# Patient Record
Sex: Male | Born: 1935 | Race: White | Hispanic: No | Smoking: Former smoker
Health system: Southern US, Community
[De-identification: ages and names within clinical notes are randomized; demographics above are authoritative.]

## PROBLEM LIST (undated history)

## (undated) DIAGNOSIS — N400 Enlarged prostate without lower urinary tract symptoms: Secondary | ICD-10-CM

## (undated) DIAGNOSIS — K56609 Unspecified intestinal obstruction, unspecified as to partial versus complete obstruction: Secondary | ICD-10-CM

## (undated) DIAGNOSIS — H269 Unspecified cataract: Secondary | ICD-10-CM

## (undated) DIAGNOSIS — I10 Essential (primary) hypertension: Secondary | ICD-10-CM

## (undated) DIAGNOSIS — E785 Hyperlipidemia, unspecified: Secondary | ICD-10-CM

## (undated) DIAGNOSIS — M1990 Unspecified osteoarthritis, unspecified site: Secondary | ICD-10-CM

## (undated) DIAGNOSIS — E669 Obesity, unspecified: Secondary | ICD-10-CM

## (undated) HISTORY — DX: Obesity, unspecified: E66.9

## (undated) HISTORY — PX: EYE SURGERY: SHX253

## (undated) HISTORY — DX: Unspecified cataract: H26.9

## (undated) HISTORY — DX: Unspecified intestinal obstruction, unspecified as to partial versus complete obstruction: K56.609

## (undated) HISTORY — PX: REPLACEMENT TOTAL KNEE: SUR1224

## (undated) HISTORY — PX: APPENDECTOMY: SHX54

---

## 2002-10-26 ENCOUNTER — Other Ambulatory Visit: Admission: RE | Admit: 2002-10-26 | Discharge: 2002-10-26 | Payer: Self-pay | Admitting: Urology

## 2004-11-13 ENCOUNTER — Other Ambulatory Visit: Admission: RE | Admit: 2004-11-13 | Discharge: 2004-11-13 | Payer: Self-pay | Admitting: Urology

## 2008-05-31 ENCOUNTER — Ambulatory Visit (HOSPITAL_COMMUNITY): Admission: RE | Admit: 2008-05-31 | Discharge: 2008-05-31 | Payer: Self-pay | Admitting: Pulmonary Disease

## 2012-06-16 ENCOUNTER — Emergency Department (HOSPITAL_COMMUNITY)
Admission: EM | Admit: 2012-06-16 | Discharge: 2012-06-16 | Disposition: A | Discharge status: Home / Self Care | Payer: Medicare HMO | Attending: Emergency Medicine | Admitting: Emergency Medicine

## 2012-06-16 ENCOUNTER — Encounter (HOSPITAL_COMMUNITY): Payer: Self-pay

## 2012-06-16 DIAGNOSIS — N401 Enlarged prostate with lower urinary tract symptoms: Secondary | ICD-10-CM | POA: Insufficient documentation

## 2012-06-16 DIAGNOSIS — R339 Retention of urine, unspecified: Secondary | ICD-10-CM | POA: Insufficient documentation

## 2012-06-16 DIAGNOSIS — E785 Hyperlipidemia, unspecified: Secondary | ICD-10-CM | POA: Insufficient documentation

## 2012-06-16 DIAGNOSIS — N138 Other obstructive and reflux uropathy: Secondary | ICD-10-CM | POA: Insufficient documentation

## 2012-06-16 DIAGNOSIS — I10 Essential (primary) hypertension: Secondary | ICD-10-CM | POA: Insufficient documentation

## 2012-06-16 HISTORY — DX: Essential (primary) hypertension: I10

## 2012-06-16 HISTORY — DX: Benign prostatic hyperplasia without lower urinary tract symptoms: N40.0

## 2012-06-16 HISTORY — DX: Hyperlipidemia, unspecified: E78.5

## 2012-06-16 MED ORDER — SOLIFENACIN SUCCINATE 5 MG PO TABS: 5.0000 mg | ORAL_TABLET | Freq: Every day | ORAL | Status: DC

## 2012-06-16 NOTE — ED Provider Notes (Signed)
History     CSN: [id number]  Arrival date & time 06/16/12  0041   First MD Initiated Contact with Patient 06/16/12 [phone]      Chief Complaint  Patient presents with  . Urinary Retention    (Consider location/radiation/quality/duration/timing/severity/associated sxs/prior treatment) HPI Miguel Hardy is a 78 y.o. male with a h/o BPH who presents to the Emergency Department complaining of urinary retention.He has been unable to void completely since last night with frequent trips to the bathroom and small amounts of urine. He has been taken off flomax in the last month. He receives his care at the [facility] (urology) and PCP. Denies fever, chills, flank pain, nausea, vomiting.   PCP [facility] P[facility] Dr. Christell Constant  Past Medical History  Diagnosis Date  . Prostate enlargement   . Hypertension   . Hyperlipidemia     Past Surgical History  Procedure Date  . Appendectomy     No family history on file.  History  Substance Use Topics  . Smoking status: Not on file  . Smokeless tobacco: Not on file  . Alcohol Use: No      Review of Systems  Constitutional: Negative for fever.       10 Systems reviewed and are negative for acute change except as noted in the HPI.  HENT: Negative for congestion.   Eyes: Negative for discharge and redness.  Respiratory: Negative for cough and shortness of breath.   Cardiovascular: Negative for chest pain.  Gastrointestinal: Negative for vomiting and abdominal pain.  Genitourinary: Positive for urgency and difficulty urinating.  Musculoskeletal: Negative for back pain.  Skin: Negative for rash.  Neurological: Negative for syncope, numbness and headaches.  Psychiatric/Behavioral:       No behavior change.    Allergies  Flomax  Home Medications   Current Outpatient Rx  Name Route Sig Dispense Refill  . HYDROCHLOROTHIAZIDE 25 MG PO TABS Oral Take 25 mg by mouth daily.    Marland Kitchen LISINOPRIL 40 MG PO TABS Oral Take 40 mg by mouth daily.    Marland Kitchen PRAVASTATIN SODIUM  40 MG PO TABS Oral Take 40 mg by mouth daily.    Marland Kitchen SOLIFENACIN SUCCINATE 5 MG PO TABS Oral Take 1 tablet (5 mg total) by mouth daily. 30 tablet 0    BP 158/65  Pulse 94  Temp 98.5 F (36.9 C) (Oral)  Resp 20  Ht 5\' 8"  (1.727 m)  Wt 200 lb (90.719 kg)  BMI 30.41 kg/m2  SpO2 94%  Physical Exam  Nursing note and vitals reviewed. Constitutional: He is oriented to person, place, and time. He appears well-developed and well-nourished.       Awake, alert, nontoxic appearance.  HENT:  Head: Atraumatic.  Eyes: Right eye exhibits no discharge. Left eye exhibits no discharge.  Neck: Neck supple.  Cardiovascular: Normal heart sounds.   Pulmonary/Chest: Effort normal and breath sounds normal. He exhibits no tenderness.  Abdominal: Soft. There is tenderness. There is no rebound.       Mild suprapubic tenderness   Genitourinary:       No cva tenderness  Musculoskeletal: He exhibits no tenderness.       Baseline ROM, no obvious new focal weakness.  Neurological: He is alert and oriented to person, place, and time.       Mental status and motor strength appears baseline for patient and situation.  Skin: No rash noted.  Psychiatric: He has a normal mood and affect.    ED Course  Procedures (including  critical care time) No results found for this or any previous visit.   1. Urinary retention       MDM  Patient with urinary retention. Foley placed with small amount of urine but relief of pressure and urgency. Patient did not want to have foley remain. Catheter was removed and patient was discharged on vesicare. He will follow up with his urologist. Pt feels improved after observation and/or treatment in ED.Pt stable in ED with no significant deterioration in condition.The patient appears reasonably screened and/or stabilized for discharge and I doubt any other medical condition or other [facility] requiring further screening, evaluation, or treatment in the ED at this time prior to  discharge.  MDM Reviewed: nursing note and vitals           Nicoletta Dress. Colon Branch, MD 06/16/12 [phone]

## 2012-06-16 NOTE — ED Notes (Signed)
Pt states he has been unable to void completely and is only dribbling, states he feels pressure in abd, admits to prostate issues

## 2012-07-01 DIAGNOSIS — H524 Presbyopia: Secondary | ICD-10-CM | POA: Insufficient documentation

## 2012-07-01 DIAGNOSIS — H5200 Hypermetropia, unspecified eye: Secondary | ICD-10-CM | POA: Insufficient documentation

## 2012-07-01 DIAGNOSIS — H2510 Age-related nuclear cataract, unspecified eye: Secondary | ICD-10-CM | POA: Insufficient documentation

## 2012-07-01 DIAGNOSIS — H43819 Vitreous degeneration, unspecified eye: Secondary | ICD-10-CM | POA: Insufficient documentation

## 2013-03-21 ENCOUNTER — Other Ambulatory Visit: Payer: Self-pay | Admitting: *Deleted

## 2013-03-21 MED ORDER — PRAVASTATIN SODIUM 40 MG PO TABS: 40.0000 mg | ORAL_TABLET | Freq: Every day | ORAL | Status: DC

## 2013-03-21 NOTE — Telephone Encounter (Signed)
Okay x1 needs an appointment to be seen by a provider

## 2013-03-21 NOTE — Telephone Encounter (Signed)
LAST LABS 6/13. NTBS

## 2013-07-11 ENCOUNTER — Encounter (INDEPENDENT_AMBULATORY_CARE_PROVIDER_SITE_OTHER): Payer: Self-pay

## 2013-07-11 ENCOUNTER — Ambulatory Visit (INDEPENDENT_AMBULATORY_CARE_PROVIDER_SITE_OTHER): Payer: Medicare HMO

## 2013-07-11 ENCOUNTER — Ambulatory Visit (INDEPENDENT_AMBULATORY_CARE_PROVIDER_SITE_OTHER): Payer: Medicare HMO | Admitting: Family Medicine

## 2013-07-11 DIAGNOSIS — R0781 Pleurodynia: Secondary | ICD-10-CM

## 2013-07-11 DIAGNOSIS — R079 Chest pain, unspecified: Secondary | ICD-10-CM

## 2013-07-11 MED ORDER — HYDROCODONE-ACETAMINOPHEN 5-325 MG PO TABS: 1.0000 | ORAL_TABLET | Freq: Four times a day (QID) | ORAL | Status: DC | PRN

## 2013-07-11 MED ORDER — NAPROXEN 500 MG PO TABS: 500.0000 mg | ORAL_TABLET | Freq: Two times a day (BID) | ORAL | Status: DC

## 2013-07-11 NOTE — Progress Notes (Signed)
  Subjective:    Patient ID: Miguel Hardy, male    DOB: 17-Aug-1930, 79 y.o.   MRN: [id number]  HPI  This 79 y.o. male presents for evaluation of right chest and side discomfort after falling and hitting a log 2 days ago.  He states he slipped when he was attempting to walk over a log and then the next thing he  Realized was that he hit his right face and side and was laying the log and he may have had "a little" LOC and when he came to he was laying across the log.  He has been having severe pain when coughing And he denies any SOB. He denies anymore LOC or AMS or mental status changes..  Review of Systems No chest pain, SOB, HA, dizziness, vision change, N/V, diarrhea, constipation, dysuria, urinary urgency or frequency, myalgias, arthralgias or rash.     Objective:   Physical Exam  Vital signs noted  Well developed well nourished male.  HEENT - Head atraumatic Normocephalic                Eyes - PERRLA, Conjuctiva - clear Sclera- Clear EOMI                Ears - EAC's Wnl TM's Wnl Gross Hearing WNL                Nose - Nares patent                 Throat - oropharanx wnl Respiratory - Lungs CTA bilateral Cardiac - RRR S1 and S2 without murmur GI - Abdomen soft Nontender and bowel sounds active x 4 Extremities - No edema. Neuro - Grossly intact.  CXR - Normal Right rib series - No fx Prelimnary reading by Angeline Slim    Assessment & Plan:  Rib pain - Plan: DG Ribs Unilateral W/Chest Right, HYDROcodone-acetaminophen (NORCO) 5-325 MG per tablet, naproxen (NAPROSYN) 500 MG tablet  Discussed with patient that he needs to splint his right ribs

## 2013-07-11 NOTE — Patient Instructions (Signed)

## 2013-07-13 DIAGNOSIS — H52229 Regular astigmatism, unspecified eye: Secondary | ICD-10-CM | POA: Insufficient documentation

## 2013-09-19 ENCOUNTER — Telehealth: Payer: Self-pay | Admitting: Family Medicine

## 2013-09-19 NOTE — Telephone Encounter (Signed)
Patient vomiting and diarrhea since Saturday can we please call in something for the vomiting patient doesn't feel like he can get here

## 2013-09-20 NOTE — Telephone Encounter (Signed)
Follow up today please

## 2013-09-20 NOTE — Telephone Encounter (Signed)
Patient was on way to urgent care

## 2013-09-21 ENCOUNTER — Emergency Department (HOSPITAL_COMMUNITY): Payer: Medicare HMO

## 2013-09-21 ENCOUNTER — Encounter (HOSPITAL_COMMUNITY): Payer: Self-pay | Admitting: Emergency Medicine

## 2013-09-21 ENCOUNTER — Inpatient Hospital Stay (HOSPITAL_COMMUNITY)
Admission: EM | Admit: 2013-09-21 | Discharge: 2013-09-28 | DRG: 389 | Disposition: A | Discharge status: Home / Self Care | Payer: Medicare HMO | Attending: Internal Medicine | Admitting: Internal Medicine

## 2013-09-21 DIAGNOSIS — Z888 Allergy status to other drugs, medicaments and biological substances status: Secondary | ICD-10-CM

## 2013-09-21 DIAGNOSIS — E7800 Pure hypercholesterolemia, unspecified: Secondary | ICD-10-CM | POA: Diagnosis present

## 2013-09-21 DIAGNOSIS — E876 Hypokalemia: Secondary | ICD-10-CM

## 2013-09-21 DIAGNOSIS — E871 Hypo-osmolality and hyponatremia: Secondary | ICD-10-CM

## 2013-09-21 DIAGNOSIS — K559 Vascular disorder of intestine, unspecified: Secondary | ICD-10-CM | POA: Diagnosis present

## 2013-09-21 DIAGNOSIS — Z79899 Other long term (current) drug therapy: Secondary | ICD-10-CM

## 2013-09-21 DIAGNOSIS — I10 Essential (primary) hypertension: Secondary | ICD-10-CM | POA: Diagnosis present

## 2013-09-21 DIAGNOSIS — E870 Hyperosmolality and hypernatremia: Secondary | ICD-10-CM | POA: Diagnosis present

## 2013-09-21 DIAGNOSIS — N179 Acute kidney failure, unspecified: Secondary | ICD-10-CM | POA: Diagnosis present

## 2013-09-21 DIAGNOSIS — K56609 Unspecified intestinal obstruction, unspecified as to partial versus complete obstruction: Principal | ICD-10-CM | POA: Diagnosis present

## 2013-09-21 DIAGNOSIS — R1110 Vomiting, unspecified: Secondary | ICD-10-CM

## 2013-09-21 DIAGNOSIS — E860 Dehydration: Secondary | ICD-10-CM | POA: Diagnosis present

## 2013-09-21 DIAGNOSIS — Z87891 Personal history of nicotine dependence: Secondary | ICD-10-CM

## 2013-09-21 DIAGNOSIS — I959 Hypotension, unspecified: Secondary | ICD-10-CM

## 2013-09-21 LAB — COMPREHENSIVE METABOLIC PANEL
ALT: 28 U/L (ref 0–53)
AST: 24 U/L (ref 0–37)
Albumin: 3.9 g/dL (ref 3.5–5.2)
Alkaline Phosphatase: 60 U/L (ref 39–117)
Chloride: 80 mEq/L — ABNORMAL LOW (ref 96–112)
Potassium: 3.4 mEq/L — ABNORMAL LOW (ref 3.7–5.3)
Total Bilirubin: 0.7 mg/dL (ref 0.3–1.2)

## 2013-09-21 LAB — CBC WITH DIFFERENTIAL/PLATELET
Basophils Absolute: 0 10*3/uL (ref 0.0–0.1)
Basophils Relative: 0 % (ref 0–1)
Hemoglobin: 15.3 g/dL (ref 13.0–17.0)
MCHC: 35.7 g/dL (ref 30.0–36.0)
Neutro Abs: 4.1 10*3/uL (ref 1.7–7.7)
Neutrophils Relative %: 68 % (ref 43–77)
RDW: 13.7 % (ref 11.5–15.5)
WBC: 6 10*3/uL (ref 4.0–10.5)

## 2013-09-21 LAB — URINALYSIS, ROUTINE W REFLEX MICROSCOPIC
Bilirubin Urine: NEGATIVE
Glucose, UA: NEGATIVE mg/dL
Ketones, ur: NEGATIVE mg/dL
Leukocytes, UA: NEGATIVE
pH: 5 (ref 5.0–8.0)

## 2013-09-21 MED ORDER — SODIUM CHLORIDE 0.9 % IV BOLUS (SEPSIS)
1000.0000 mL | Freq: Once | INTRAVENOUS | Status: AC
  Administered 2013-09-21: 1000 mL via INTRAVENOUS

## 2013-09-21 MED ORDER — POTASSIUM CHLORIDE 10 MEQ/100ML IV SOLN
10.0000 meq | Freq: Once | INTRAVENOUS | Status: AC
  Administered 2013-09-21: 10 meq via INTRAVENOUS
  Filled 2013-09-21: qty 100

## 2013-09-21 MED ORDER — ONDANSETRON HCL 4 MG/2ML IJ SOLN: 4.0000 mg | Freq: Once | INTRAMUSCULAR | Status: DC

## 2013-09-21 MED ORDER — ONDANSETRON HCL 4 MG/2ML IJ SOLN
INTRAMUSCULAR | Status: AC
  Filled 2013-09-21: qty 2

## 2013-09-21 MED ORDER — ONDANSETRON HCL 4 MG/2ML IJ SOLN
4.0000 mg | Freq: Once | INTRAMUSCULAR | Status: AC
  Administered 2013-09-21: 4 mg via INTRAVENOUS
  Filled 2013-09-21: qty 2

## 2013-09-21 MED ORDER — IOHEXOL 300 MG/ML  SOLN
50.0000 mL | Freq: Once | INTRAMUSCULAR | Status: AC | PRN
  Administered 2013-09-21: 50 mL via ORAL

## 2013-09-21 MED ORDER — FENTANYL CITRATE 0.05 MG/ML IJ SOLN
50.0000 ug | Freq: Once | INTRAMUSCULAR | Status: AC
  Administered 2013-09-21: 50 ug via INTRAVENOUS
  Filled 2013-09-21: qty 2

## 2013-09-21 MED ORDER — ONDANSETRON HCL 4 MG/2ML IJ SOLN
4.0000 mg | Freq: Once | INTRAMUSCULAR | Status: AC
Start: 2013-09-21 — End: 2013-09-21
  Administered 2013-09-21: 4 mg via INTRAVENOUS

## 2013-09-21 NOTE — ED Provider Notes (Signed)
CSN: [id number]     Arrival date & time 09/21/13  1841 History   First MD Initiated Contact with Patient 09/21/13 1902     Chief Complaint  Patient presents with  . Emesis   (Consider location/radiation/quality/duration/timing/severity/associated sxs/prior Treatment) HPI Comments: 79 yo male with appendix, htn, lipids hx presents with recurrent vomiting and diarrhea, non bloody since Saturday.  Mild sick contacts with gastritis. Pt recently came back from [place]. No new foods.  No recent abx.  No bowel obstruction hx.  Subjective fevers.  Central abdominal pain, cramping.  Nothing improves.  Intermittent.  Patient is a 79 y.o. male presenting with vomiting. The history is provided by the patient.  Emesis Severity:  Moderate Associated symptoms: abdominal pain and diarrhea   Associated symptoms: no chills and no headaches     Past Medical History  Diagnosis Date  . Prostate enlargement   . Hypertension   . Hyperlipidemia    Past Surgical History  Procedure Laterality Date  . Appendectomy     History reviewed. No pertinent family history. History  Substance Use Topics  . Smoking status: Former Smoker -- 1.00 packs/day    Types: Cigarettes    Start date: 02/08/1948    Quit date: 09/22/1974  . Smokeless tobacco: Not on file  . Alcohol Use: No    Review of Systems  Constitutional: Positive for fever, appetite change and fatigue. Negative for chills.  HENT: Negative for congestion.   Eyes: Negative for visual disturbance.  Respiratory: Negative for shortness of breath.   Cardiovascular: Negative for chest pain.  Gastrointestinal: Positive for nausea, vomiting, abdominal pain and diarrhea. Negative for blood in stool.  Genitourinary: Negative for dysuria and flank pain.  Musculoskeletal: Negative for back pain, neck pain and neck stiffness.  Skin: Negative for rash.  Neurological: Positive for light-headedness. Negative for headaches.    Allergies  Flomax  Home  Medications   Current Outpatient Rx  Name  Route  Sig  Dispense  Refill  . atorvastatin (LIPITOR) 40 MG tablet   Oral   Take 40 mg by mouth daily.         . hydrochlorothiazide (HYDRODIURIL) 25 MG tablet   Oral   Take 25 mg by mouth daily.         Marland Kitchen HYDROcodone-acetaminophen (NORCO) 5-325 MG per tablet   Oral   Take 1 tablet by mouth every 6 (six) hours as needed for pain.   30 tablet   0   . lisinopril (PRINIVIL,ZESTRIL) 40 MG tablet   Oral   Take 40 mg by mouth daily.         . naproxen (NAPROSYN) 500 MG tablet   Oral   Take 1 tablet (500 mg total) by mouth 2 (two) times daily with a meal.   20 tablet   0   . solifenacin (VESICARE) 5 MG tablet   Oral   Take 1 tablet (5 mg total) by mouth daily.   30 tablet   0    BP 64/22  Pulse 95  Temp(Src) 98 F (36.7 C) (Oral)  Ht 5\' 8"  (1.727 m)  Wt 195 lb (88.451 kg)  BMI 29.66 kg/m2  SpO2 97% Physical Exam  Nursing note and vitals reviewed. Constitutional: He is oriented to person, place, and time. He appears well-developed and well-nourished.  HENT:  Head: Normocephalic and atraumatic.  Dry mm  Eyes: Conjunctivae are normal. Right eye exhibits no discharge. Left eye exhibits no discharge.  Neck: Normal range of motion.  Neck supple. No tracheal deviation present.  Cardiovascular: Normal rate and regular rhythm.   Pulmonary/Chest: Effort normal and breath sounds normal.  Abdominal: Soft. He exhibits distension (tympanic BS). There is tenderness (diffuse, worse central). There is no guarding.  Musculoskeletal: He exhibits no edema.  Neurological: He is alert and oriented to person, place, and time.  Skin: Skin is warm. No rash noted.  Psychiatric: He has a normal mood and affect.    ED Course  Procedures (including critical care time) EMERGENCY DEPARTMENT ULTRASOUND  Study: Limited Retroperitoneal Ultrasound of the Abdominal Aorta.  INDICATIONS:Hypotension, Abdominal pain and Age>55 Multiple views of the  abdominal aorta were obtained in real-time from the diaphragmatic hiatus to the aortic bifurcation in transverse planes with a multi-frequency probe. PERFORMED BY: Myself IMAGES ARCHIVED?: Yes FINDINGS: Free fluid absent LIMITATIONS:  Body habitus, Bowel gas and Abdominal pain INTERPRETATION:  No abdominal aortic aneurysm only distal visualized    Labs Review Labs Reviewed  CBC WITH DIFFERENTIAL - Abnormal; Notable for the following:    Monocytes Relative 15 (*)    All other components within normal limits  COMPREHENSIVE METABOLIC PANEL - Abnormal; Notable for the following:    Sodium 129 (*)    Potassium 3.4 (*)    Chloride 80 (*)    Glucose, Bld 136 (*)    BUN 129 (*)    Creatinine, Ser 4.00 (*)    GFR calc non Af Amer 13 (*)    GFR calc Af Amer 15 (*)    All other components within normal limits  URINALYSIS, ROUTINE W REFLEX MICROSCOPIC - Abnormal; Notable for the following:    Specific Gravity, Urine >1.030 (*)    All other components within normal limits  LIPASE, BLOOD - Abnormal; Notable for the following:    Lipase 61 (*)    All other components within normal limits  LACTIC ACID, PLASMA   Imaging Review Dg Abd Acute W/chest  09/21/2013   CLINICAL DATA:  Nausea vomiting and diarrhea for 4 days.  EXAM: ACUTE ABDOMEN SERIES (ABDOMEN 2 VIEW & CHEST 1 VIEW)  COMPARISON:  Chest x-ray 06/2013 and 05/31/2008  FINDINGS: Examination demonstrates the lungs to be adequately inflated without consolidation or effusion. The cardiomediastinal silhouette and remainder of the thorax is unchanged.  Abdominal pelvic images demonstrate multiple air-filled dilated small bowel loops measuring up to 6 cm in diameter. There are several scattered air-fluid levels. There is no free peritoneal air. There are degenerative changes of the spine and hips.  IMPRESSION: Multiple air-filled moderately dilated small bowel loops with air-fluid levels suggesting distal small bowel obstruction and less likely  ileus.  No acute cardiopulmonary disease.   Electronically Signed   By: Elberta Fortis M.D.   On: 09/21/2013 21:02    EKG Interpretation   None       MDM   1. SBO (small bowel obstruction)   2. ARF (acute renal failure)   3. Dehydration   4. Vomiting   Hypotension  Pt presented with abd pain, vomiting and hypotension. Bedside US difficult due to bowel gas but portions of abd aorta normal size, no free fluid in abdomen. Clinically dry.  IV fluids, zofran, labs. Concern for GE vs partial bowel obstruction vs other. Second L bolus given. Xrays show signs of SBO.  Paged surgery and triad for admission. Dr Lovell Sheehan rec CT no contrast then admit to [facility], NG if CT confirms SBO.  The patients results and plan were reviewed and discussed.   Any  x-rays performed were personally reviewed by myself.   Differential diagnosis were considered with the presenting HPI.  Diagnosis: above  Admission/ observation were discussed with the admitting physician, patient and/or family and they are comfortable with the plan.  Signed out to fup CT results and ensure admission.    Enid Skeens, MD 09/21/13 2126

## 2013-09-21 NOTE — ED Notes (Signed)
Vomiting and diarrhea for 4 days, seen at [facility] yesterday and diarrhea is improved , but cont to vomit

## 2013-09-21 NOTE — ED Notes (Signed)
Report called to Shauna, RN on unit 300. 

## 2013-09-21 NOTE — H&P (Addendum)
[facility] History and Physical  GOPAL MALTER ZOX:[id number] DOB: 11-19-1929 DOA: 09/21/2013   PCP: Rudi Heap, MD  Specialists: None  Chief Complaint: Vomiting since Saturday  HPI: Miguel Hardy is a 79 y.o. male with a past medical history of hypertension, and hypercholesterolemia, who was in his usual state of health of this past Saturday when he started having episodes of diarrhea, along with vomiting. He also had abdominal pain, which was 6/10 in intensity, which was aching pain in the lower abdomen. There was no radiation of the pain. No precipitating, aggravating or relieving factors were identified. Denies any blood in the stool or in the emesis. The diarrhea subsided after one day, but he continued to vomit 3-4 times a day. He had fever and chills, but did not check his temperature. He also had abdominal bloating. He passed gas, about 30 minutes ago. He's never had this kind of problem in the past. Denies any problems passing urine. He has been constipated in the past. Has had some dizziness, but denies any syncopal episodes.  Home Medications: Prior to Admission medications   Medication Sig Start Date End Date Taking? Authorizing Provider  atorvastatin (LIPITOR) 40 MG tablet Take 40 mg by mouth daily.   Yes Historical Provider, MD  dicyclomine (BENTYL) 10 MG capsule Take 10 mg by mouth 4 (four) times daily -  before meals and at bedtime. 09/20/13  Yes Historical Provider, MD  diphenoxylate-atropine (LOMOTIL) 2.5-0.025 MG per tablet Take 1 tablet by mouth as needed for diarrhea or loose stools.  09/20/13  Yes Historical Provider, MD  hydrochlorothiazide (HYDRODIURIL) 25 MG tablet Take 25 mg by mouth daily.   Yes Historical Provider, MD  lisinopril (PRINIVIL,ZESTRIL) 40 MG tablet Take 40 mg by mouth daily.   Yes Historical Provider, MD    Allergies:  Allergies  Allergen Reactions  . Flomax [Tamsulosin Hcl]     Past Medical History: Past Medical History  Diagnosis Date   . Prostate enlargement   . Hypertension   . Hyperlipidemia     Past Surgical History  Procedure Laterality Date  . Appendectomy      Social History: He lives in [place] with his daughter. Quit smoking in 1976. No alcohol use. No illicit drug use. Independent with daily activities.  Family History: He denies any medical problems in the family  Review of Systems - History obtained from the patient General ROS: positive for  - fatigue Psychological ROS: negative Ophthalmic ROS: negative ENT ROS: negative Allergy and Immunology ROS: negative Hematological and Lymphatic ROS: negative Endocrine ROS: negative Respiratory ROS: no cough, shortness of breath, or wheezing Cardiovascular ROS: no chest pain or dyspnea on exertion Gastrointestinal ROS: as in hpi Genito-Urinary ROS: no dysuria, trouble voiding, or hematuria Musculoskeletal ROS: negative Neurological ROS: no TIA or stroke symptoms Dermatological ROS: negative  Physical Examination  Filed Vitals:   09/21/13 1905 09/21/13 2100 09/21/13 2101 09/21/13 2200  BP: 90/62 151/83 151/83 148/67  Pulse:  46 74 78  Temp:      TempSrc:      Resp:   16   Height:      Weight:      SpO2:  95% 96% 98%    General appearance: alert, cooperative, appears stated age, no distress and moderately obese Head: Normocephalic, without obvious abnormality, atraumatic Eyes: conjunctivae/corneas clear. PERRL, EOM's intact.  Throat: very dry mucus membranes Resp: clear to auscultation bilaterally Cardio: regular rate and rhythm, S1, S2 normal, no murmur, click, rub or  gallop GI: Abdomen is distended, but soft. There is some tenderness in the lower abdomen without any rebound, rigidity, or guarding. No masses, or organomegaly. Bowel sounds are present. Extremities: extremities normal, atraumatic, no cyanosis or edema Pulses: 2+ and symmetric Skin: Skin color, texture, turgor normal. No rashes or lesions Lymph nodes: Cervical,  supraclavicular, and axillary nodes normal. Neurologic: No focal deficits are present.  Laboratory Data: Results for orders placed during the hospital encounter of 09/21/13 (from the past 48 hour(s))  CBC WITH DIFFERENTIAL     Status: Abnormal   Collection Time    09/21/13  7:25 PM      Result Value Range   WBC 6.0  4.0 - 10.5 K/uL   RBC 5.03  4.22 - 5.81 MIL/uL   Hemoglobin 15.3  13.0 - 17.0 g/dL   HCT 40.9  81.1 - 91.4 %   MCV 85.3  78.0 - 100.0 fL   MCH 30.4  26.0 - 34.0 pg   MCHC 35.7  30.0 - 36.0 g/dL   RDW 78.2  95.6 - 21.3 %   Platelets 368  150 - 400 K/uL   Neutrophils Relative % 68  43 - 77 %   Neutro Abs 4.1  1.7 - 7.7 K/uL   Lymphocytes Relative 16  12 - 46 %   Lymphs Abs 1.0  0.7 - 4.0 K/uL   Monocytes Relative 15 (*) 3 - 12 %   Monocytes Absolute 0.9  0.1 - 1.0 K/uL   Eosinophils Relative 1  0 - 5 %   Eosinophils Absolute 0.1  0.0 - 0.7 K/uL   Basophils Relative 0  0 - 1 %   Basophils Absolute 0.0  0.0 - 0.1 K/uL  COMPREHENSIVE METABOLIC PANEL     Status: Abnormal   Collection Time    09/21/13  7:25 PM      Result Value Range   Sodium 129 (*) 137 - 147 mEq/L   Comment: Please note change in reference range.   Potassium 3.4 (*) 3.7 - 5.3 mEq/L   Comment: Please note change in reference range.   Chloride 80 (*) 96 - 112 mEq/L   CO2 24  19 - 32 mEq/L   Glucose, Bld 136 (*) 70 - 99 mg/dL   BUN 086 (*) 6 - 23 mg/dL   Creatinine, Ser 5.78 (*) 0.50 - 1.35 mg/dL   Calcium 9.5  8.4 - 46.9 mg/dL   Total Protein 8.1  6.0 - 8.3 g/dL   Albumin 3.9  3.5 - 5.2 g/dL   AST 24  0 - 37 U/L   ALT 28  0 - 53 U/L   Alkaline Phosphatase 60  39 - 117 U/L   Total Bilirubin 0.7  0.3 - 1.2 mg/dL   GFR calc non Af Amer 13 (*) >90 mL/min   GFR calc Af Amer 15 (*) >90 mL/min   Comment: (NOTE)     The eGFR has been calculated using the CKD EPI equation.     This calculation has not been validated in all clinical situations.     eGFR's persistently <90 mL/min signify possible  Chronic Kidney     Disease.  LACTIC ACID, PLASMA     Status: None   Collection Time    09/21/13  7:25 PM      Result Value Range   Lactic Acid, Venous 1.6  0.5 - 2.2 mmol/L  LIPASE, BLOOD     Status: Abnormal   Collection Time  09/21/13  7:25 PM      Result Value Range   Lipase 61 (*) 11 - 59 U/L  URINALYSIS, ROUTINE W REFLEX MICROSCOPIC     Status: Abnormal   Collection Time    09/21/13  7:48 PM      Result Value Range   Color, Urine YELLOW  YELLOW   APPearance CLEAR  CLEAR   Specific Gravity, Urine >1.030 (*) 1.005 - 1.030   pH 5.0  5.0 - 8.0   Glucose, UA NEGATIVE  NEGATIVE mg/dL   Hgb urine dipstick NEGATIVE  NEGATIVE   Bilirubin Urine NEGATIVE  NEGATIVE   Ketones, ur NEGATIVE  NEGATIVE mg/dL   Protein, ur NEGATIVE  NEGATIVE mg/dL   Urobilinogen, UA 0.2  0.0 - 1.0 mg/dL   Nitrite NEGATIVE  NEGATIVE   Leukocytes, UA NEGATIVE  NEGATIVE   Comment: MICROSCOPIC NOT DONE ON URINES WITH NEGATIVE PROTEIN, BLOOD, LEUKOCYTES, NITRITE, OR GLUCOSE <1000 mg/dL.    Radiology Reports: Dg Abd Acute W/chest  09/21/2013   CLINICAL DATA:  Nausea vomiting and diarrhea for 4 days.  EXAM: ACUTE ABDOMEN SERIES (ABDOMEN 2 VIEW & CHEST 1 VIEW)  COMPARISON:  Chest x-ray 06/2013 and 05/31/2008  FINDINGS: Examination demonstrates the lungs to be adequately inflated without consolidation or effusion. The cardiomediastinal silhouette and remainder of the thorax is unchanged.  Abdominal pelvic images demonstrate multiple air-filled dilated small bowel loops measuring up to 6 cm in diameter. There are several scattered air-fluid levels. There is no free peritoneal air. There are degenerative changes of the spine and hips.  IMPRESSION: Multiple air-filled moderately dilated small bowel loops with air-fluid levels suggesting distal small bowel obstruction and less likely ileus.  No acute cardiopulmonary disease.   Electronically Signed   By: Elberta Fortis M.D.   On: 09/21/2013 21:02    Problem  List  Principal Problem:   SBO (small bowel obstruction) Active Problems:   ARF (acute renal failure)   HTN (hypertension), benign   Hypercholesterolemia   Dehydration   Assessment: This is 79 year old, Caucasian male, with a past medical history as stated earlier, who presents with vomiting since Saturday. His abdominal film is concerning for small bowel obstruction. CT of his abdomen and pelvis is pending at this time. He reports appendectomy in the past and so could have adhesions. He is also noted to have acute renal failure, which is likely due to prerenal azotemia.  Plan: #1 possible small bowel obstruction: General surgery has been consulted and they will see the patient in the morning. Depending on the results of the CT scan a NG tube may have to be placed. He'll be kept n.p.o.  #2 acute renal failure: No older labs are available. This is most likely acute due to dehydration. And, then he was also on ACE inhibitor and diuretic at home. We'll give him IV fluids. Monitor urine output closely. If his renal function does not improve by the morning ultrasound of his kidneys will be obtained.  #3 mildly elevated lipase levels: Most likely due to vomiting. We will trend. LFTs are normal.  #4 history of hypertension: Blood pressure was low when he initially presented but it quickly improved with IV fluids. Hold his oral antihypertensive agents for now.   DVT Prophylaxis: Heparin Code Status: Full code Family Communication: Discussed with the patient and his daughter  Disposition Plan: Admit to MedSurg   Further management decisions will depend on results of further testing and patient's response to treatment.  Trevel Dillenbeck  [facility] Pager [phone]  If 7PM-7AM, please contact night-coverage [email or web address] Password [facility]  09/21/2013, 11:25 PM  CT abdomen pelvis results were reviewed. I was called by the ED physician. He will notify Dr. Lovell Sheehan. Lactic acid level was  normal. Further management will be deferred to the general surgeon.  David Towson 12:01 AM

## 2013-09-22 ENCOUNTER — Inpatient Hospital Stay (HOSPITAL_COMMUNITY): Payer: Medicare HMO

## 2013-09-22 ENCOUNTER — Encounter (HOSPITAL_COMMUNITY): Payer: Self-pay | Admitting: *Deleted

## 2013-09-22 DIAGNOSIS — N179 Acute kidney failure, unspecified: Secondary | ICD-10-CM

## 2013-09-22 DIAGNOSIS — K5289 Other specified noninfective gastroenteritis and colitis: Secondary | ICD-10-CM

## 2013-09-22 DIAGNOSIS — K56609 Unspecified intestinal obstruction, unspecified as to partial versus complete obstruction: Secondary | ICD-10-CM

## 2013-09-22 DIAGNOSIS — K559 Vascular disorder of intestine, unspecified: Secondary | ICD-10-CM | POA: Diagnosis present

## 2013-09-22 DIAGNOSIS — I10 Essential (primary) hypertension: Secondary | ICD-10-CM

## 2013-09-22 DIAGNOSIS — K560 Paralytic ileus: Secondary | ICD-10-CM

## 2013-09-22 DIAGNOSIS — E860 Dehydration: Secondary | ICD-10-CM

## 2013-09-22 HISTORY — DX: Unspecified intestinal obstruction, unspecified as to partial versus complete obstruction: K56.609

## 2013-09-22 LAB — APTT: aPTT: 30 seconds (ref 24–37)

## 2013-09-22 LAB — PROTIME-INR
INR: 1.1 (ref 0.00–1.49)
Prothrombin Time: 14 seconds (ref 11.6–15.2)

## 2013-09-22 MED ORDER — SODIUM CHLORIDE 0.9 % IV SOLN
INTRAVENOUS | Status: DC
  Administered 2013-09-22: 01:00:00 via INTRAVENOUS
  Administered 2013-09-22: 100 mL/h via INTRAVENOUS
  Administered 2013-09-23 – 2013-09-25 (×4): via INTRAVENOUS

## 2013-09-22 MED ORDER — POTASSIUM CHLORIDE 10 MEQ/100ML IV SOLN
10.0000 meq | Freq: Once | INTRAVENOUS | Status: AC
  Administered 2013-09-22: 10 meq via INTRAVENOUS
  Filled 2013-09-22: qty 100

## 2013-09-22 MED ORDER — ONDANSETRON HCL 4 MG/2ML IJ SOLN
4.0000 mg | Freq: Four times a day (QID) | INTRAMUSCULAR | Status: DC | PRN
Start: 2013-09-22 — End: 2013-09-28
  Administered 2013-09-24: 4 mg via INTRAVENOUS
  Filled 2013-09-22: qty 2

## 2013-09-22 MED ORDER — ACETAMINOPHEN 650 MG RE SUPP
650.0000 mg | Freq: Four times a day (QID) | RECTAL | Status: DC | PRN
  Administered 2013-09-24: 650 mg via RECTAL
  Filled 2013-09-22: qty 1

## 2013-09-22 MED ORDER — VANCOMYCIN HCL IN DEXTROSE 1-5 GM/200ML-% IV SOLN: 1000.0000 mg | INTRAVENOUS | Status: DC

## 2013-09-22 MED ORDER — HYDROMORPHONE HCL PF 1 MG/ML IJ SOLN: 1.0000 mg | INTRAMUSCULAR | Status: DC | PRN

## 2013-09-22 MED ORDER — PIPERACILLIN-TAZOBACTAM 3.375 G IVPB
INTRAVENOUS | Status: AC
  Filled 2013-09-22: qty 50

## 2013-09-22 MED ORDER — VANCOMYCIN HCL 10 G IV SOLR
1750.0000 mg | Freq: Once | INTRAVENOUS | Status: DC
  Filled 2013-09-22: qty 1750

## 2013-09-22 MED ORDER — PIPERACILLIN-TAZOBACTAM IN DEX 2-0.25 GM/50ML IV SOLN
2.2500 g | Freq: Four times a day (QID) | INTRAVENOUS | Status: DC
  Administered 2013-09-22 – 2013-09-23 (×3): 2.25 g via INTRAVENOUS
  Filled 2013-09-22 (×6): qty 50

## 2013-09-22 MED ORDER — VANCOMYCIN HCL IN DEXTROSE 1-5 GM/200ML-% IV SOLN
INTRAVENOUS | Status: AC
  Filled 2013-09-22: qty 200

## 2013-09-22 MED ORDER — ONDANSETRON HCL 4 MG PO TABS
4.0000 mg | ORAL_TABLET | Freq: Four times a day (QID) | ORAL | Status: DC | PRN
Start: 2013-09-22 — End: 2013-09-28

## 2013-09-22 MED ORDER — PIPERACILLIN-TAZOBACTAM 3.375 G IVPB 30 MIN
3.3750 g | Freq: Once | INTRAVENOUS | Status: AC
  Administered 2013-09-22: 3.375 g via INTRAVENOUS
  Filled 2013-09-22: qty 50

## 2013-09-22 MED ORDER — PIPERACILLIN-TAZOBACTAM IN DEX 2-0.25 GM/50ML IV SOLN
2.2500 g | Freq: Four times a day (QID) | INTRAVENOUS | Status: DC
  Filled 2013-09-22: qty 50

## 2013-09-22 MED ORDER — HEPARIN SODIUM (PORCINE) 5000 UNIT/ML IJ SOLN
5000.0000 [IU] | Freq: Three times a day (TID) | INTRAMUSCULAR | Status: DC
Start: 2013-09-22 — End: 2013-09-22

## 2013-09-22 MED ORDER — PIPERACILLIN-TAZOBACTAM 3.375 G IVPB 30 MIN
3.3750 g | Freq: Once | INTRAVENOUS | Status: DC
  Filled 2013-09-22: qty 50

## 2013-09-22 MED ORDER — ACETAMINOPHEN 325 MG PO TABS
650.0000 mg | ORAL_TABLET | Freq: Four times a day (QID) | ORAL | Status: DC | PRN
Start: 2013-09-22 — End: 2013-09-28

## 2013-09-22 MED ORDER — VANCOMYCIN HCL IN DEXTROSE 750-5 MG/150ML-% IV SOLN
750.0000 mg | Freq: Once | INTRAVENOUS | Status: AC
  Administered 2013-09-22: 750 mg via INTRAVENOUS
  Filled 2013-09-22: qty 150

## 2013-09-22 MED ORDER — VANCOMYCIN HCL IN DEXTROSE 1-5 GM/200ML-% IV SOLN
1000.0000 mg | Freq: Once | INTRAVENOUS | Status: AC
Start: 2013-09-22 — End: 2013-09-22
  Administered 2013-09-22: 1000 mg via INTRAVENOUS
  Filled 2013-09-22: qty 200

## 2013-09-22 NOTE — Progress Notes (Signed)
[facility] called with ETA of about 20 minutes. Report was given at that time.

## 2013-09-22 NOTE — Progress Notes (Signed)
Hospitalist spoke with the patient by phone to inform him that [facility] is being [facility]. Patient then called his daughter to inform her of the transfer.

## 2013-09-22 NOTE — Progress Notes (Signed)
Report called to Aundra MilletMegan, RN at [facility]. Patient will be going to room 2C15C-1.

## 2013-09-22 NOTE — Progress Notes (Signed)
ANTIBIOTIC CONSULT NOTE-Preliminary  Pharmacy Consult for Vancomycin and Zosyn Indication: Small bowel ischemia   Allergies  Allergen Reactions  . Flomax [Tamsulosin Hcl]     Patient Measurements: Height: 5\' 8"  (172.7 cm) Weight: 195 lb (88.451 kg) IBW/kg (Calculated) : 68.4  Vital Signs: Temp: 98 F (36.7 C) (01/01 0012) Temp src: Oral (01/01 0012) BP: 149/81 mmHg (01/01 0012) Pulse Rate: 77 (01/01 0012)  Labs:  Recent Labs  09/21/13 1925  WBC 6.0  HGB 15.3  PLT 368  CREATININE 4.00*    Estimated Creatinine Clearance: 15.7 ml/min (by C-G formula based on Cr of 4).  No results found for this basename: VANCOTROUGH, VANCOPEAK, VANCORANDOM, GENTTROUGH, GENTPEAK, GENTRANDOM, TOBRATROUGH, TOBRAPEAK, TOBRARND, AMIKACINPEAK, AMIKACINTROU, AMIKACIN,  in the last 72 hours   Microbiology: No results found for this or any previous visit (from the past 720 hour(s)).  Medical History: Past Medical History  Diagnosis Date  . Prostate enlargement   . Hypertension   . Hyperlipidemia     Medications:  Zosyn 3.375 Gm IV x 1 dose in the Ed  Assessment: 80 yo male with abdominal pain, vomiting, chills and fever. X-ray evidence of small bowel obstruction/ischemia. Empiric antibiotics.  Goal of Therapy:  Vancomycin troughs 15-20 mcg/ml Eradication of infection  Plan:  Preliminary review of pertinent patient information completed.  Protocol will be initiated with a one-time dose of Vancomycin 1 Gm IV.  Jeani HawkingAnnie Penn clinical pharmacist will complete review during morning rounds to assess patient and finalize treatment regimen.  Arelia SneddonMason, Adel Burch Anne, [facility] 09/22/2013,2:22 AM

## 2013-09-22 NOTE — Consult Note (Signed)
Reason for Consult:portal venous gas, dilated small bowel Referring Physician: Dr. Rushie Nyhan is an 80 y.o. male.  HPI: the patient is an 80 year old male whose transferred from outside hospital secondary to portal venous gas upon CT scan examination. Patient states that beginning Monday he began with abdominal pain, nausea, emesis, and diarrhea. Since that time he had diarrhea up until 2 days ago. He states that stopped however has not had a normal bowel movement since then.  Upon evaluation in the ER patient underwent an NG tube placement and suctioned of gastric contents which he states makes him feel better result the pain. The patient states he's had no bloody bowel movements.  Past Medical History  Diagnosis Date  . Prostate enlargement   . Hypertension   . Hyperlipidemia     Past Surgical History  Procedure Laterality Date  . Appendectomy      History reviewed. No pertinent family history.  Social History:  reports that he quit smoking about 39 years ago. His smoking use included Cigarettes. He started smoking about 65 years ago. He smoked 1.00 pack per day. He does not have any smokeless tobacco history on file. He reports that he does not drink alcohol or use illicit drugs.  Allergies:  Allergies  Allergen Reactions  . Flomax [Tamsulosin Hcl]     Medications: I have reviewed the patient's current medications.  Results for orders placed during the hospital encounter of 09/21/13 (from the past 48 hour(s))  CBC WITH DIFFERENTIAL     Status: Abnormal   Collection Time    09/21/13  7:25 PM      Result Value Range   WBC 6.0  4.0 - 10.5 K/uL   RBC 5.03  4.22 - 5.81 MIL/uL   Hemoglobin 15.3  13.0 - 17.0 g/dL   HCT 42.9  39.0 - 52.0 %   MCV 85.3  78.0 - 100.0 fL   MCH 30.4  26.0 - 34.0 pg   MCHC 35.7  30.0 - 36.0 g/dL   RDW 13.7  11.5 - 15.5 %   Platelets 368  150 - 400 K/uL   Neutrophils Relative % 68  43 - 77 %   Neutro Abs 4.1  1.7 - 7.7 K/uL    Lymphocytes Relative 16  12 - 46 %   Lymphs Abs 1.0  0.7 - 4.0 K/uL   Monocytes Relative 15 (*) 3 - 12 %   Monocytes Absolute 0.9  0.1 - 1.0 K/uL   Eosinophils Relative 1  0 - 5 %   Eosinophils Absolute 0.1  0.0 - 0.7 K/uL   Basophils Relative 0  0 - 1 %   Basophils Absolute 0.0  0.0 - 0.1 K/uL  COMPREHENSIVE METABOLIC PANEL     Status: Abnormal   Collection Time    09/21/13  7:25 PM      Result Value Range   Sodium 129 (*) 137 - 147 mEq/L   Comment: Please note change in reference range.   Potassium 3.4 (*) 3.7 - 5.3 mEq/L   Comment: Please note change in reference range.   Chloride 80 (*) 96 - 112 mEq/L   CO2 24  19 - 32 mEq/L   Glucose, Bld 136 (*) 70 - 99 mg/dL   BUN 129 (*) 6 - 23 mg/dL   Creatinine, Ser 4.00 (*) 0.50 - 1.35 mg/dL   Calcium 9.5  8.4 - 10.5 mg/dL   Total Protein 8.1  6.0 - 8.3 g/dL  Albumin 3.9  3.5 - 5.2 g/dL   AST 24  0 - 37 U/L   ALT 28  0 - 53 U/L   Alkaline Phosphatase 60  39 - 117 U/L   Total Bilirubin 0.7  0.3 - 1.2 mg/dL   GFR calc non Af Amer 13 (*) >90 mL/min   GFR calc Af Amer 15 (*) >90 mL/min   Comment: (NOTE)     The eGFR has been calculated using the CKD EPI equation.     This calculation has not been validated in all clinical situations.     eGFR's persistently <90 mL/min signify possible Chronic Kidney     Disease.  LACTIC ACID, PLASMA     Status: None   Collection Time    09/21/13  7:25 PM      Result Value Range   Lactic Acid, Venous 1.6  0.5 - 2.2 mmol/L  LIPASE, BLOOD     Status: Abnormal   Collection Time    09/21/13  7:25 PM      Result Value Range   Lipase 61 (*) 11 - 59 U/L  URINALYSIS, ROUTINE W REFLEX MICROSCOPIC     Status: Abnormal   Collection Time    09/21/13  7:48 PM      Result Value Range   Color, Urine YELLOW  YELLOW   APPearance CLEAR  CLEAR   Specific Gravity, Urine >1.030 (*) 1.005 - 1.030   pH 5.0  5.0 - 8.0   Glucose, UA NEGATIVE  NEGATIVE mg/dL   Hgb urine dipstick NEGATIVE  NEGATIVE   Bilirubin  Urine NEGATIVE  NEGATIVE   Ketones, ur NEGATIVE  NEGATIVE mg/dL   Protein, ur NEGATIVE  NEGATIVE mg/dL   Urobilinogen, UA 0.2  0.0 - 1.0 mg/dL   Nitrite NEGATIVE  NEGATIVE   Leukocytes, UA NEGATIVE  NEGATIVE   Comment: MICROSCOPIC NOT DONE ON URINES WITH NEGATIVE PROTEIN, BLOOD, LEUKOCYTES, NITRITE, OR GLUCOSE <1000 mg/dL.  PROTIME-INR     Status: None   Collection Time    09/22/13 12:18 AM      Result Value Range   Prothrombin Time 14.0  11.6 - 15.2 seconds   INR 1.10  0.00 - 1.49  APTT     Status: None   Collection Time    09/22/13 12:18 AM      Result Value Range   aPTT 30  24 - 37 seconds    Ct Abdomen Pelvis Wo Contrast  09/21/2013   CLINICAL DATA:  Vomiting and diarrhea for 4 days. Appendectomy. Hypertension.  EXAM: CT ABDOMEN AND PELVIS WITHOUT CONTRAST  TECHNIQUE: Multidetector CT imaging of the abdomen and pelvis was performed following the standard protocol without intravenous contrast.  COMPARISON:  Plain films of earlier in the day.  No prior CT.  FINDINGS: Lower Chest: Clear lung bases. Normal heart size without pericardial or pleural effusion. Multivessel coronary artery atherosclerosis.  Abdomen/Pelvis: Mild hepatic steatosis. Small volume portal venous gas, including within the left lobe of the liver on image 20.  Splenule. Mild gastric distension. Normal pancreas, gallbladder, biliary tract, adrenal glands. Interpolar right renal lesion which measures fluid density and 1.7 cm, most consistent with a cyst. Smaller bilateral renal lesions which also measure fluid density and are likely cysts. Renal cortical thinning bilaterally.  Aortic and branch vessel atherosclerosis. No retroperitoneal or retrocrural adenopathy. Extensive colonic diverticulosis, including within the sigmoid. Normal terminal ileum.  Small bowel loops are dilated and fluid-filled. Undergo a relatively gradual transition to normal to  decompressed distal small bowel. No abrupt transition identified.  No  extraluminal gas identified. Lucencies within mid small bowel loops are suspicious for pneumatosis. Example on image 75/series 2.  Bilateral fat containing inguinal hernias. No pelvic adenopathy. Normal urinary bladder and prostate. No significant free fluid.  Bones/Musculoskeletal: Remote anterior right rib fractures. Degenerative disc disease involves lower lumbar spine.  IMPRESSION: 1. Portal venous gas with proximal and mid small dilatation and suspicion of small volume pneumatosis. Findings are overall suspicious for small bowel ischemia with secondary adynamic ileus. Correlate with lactate levels and clinical exam. These results were called by telephone at the time of interpretation on 09/21/2013 at 11:53 PM to Dr. Christy Gentles , who verbally acknowledged these results. 2. Coronary artery atherosclerosis.   Electronically Signed   By: Abigail Miyamoto M.D.   On: 09/21/2013 23:55   Dg Abd Acute W/chest  09/21/2013   CLINICAL DATA:  Nausea vomiting and diarrhea for 4 days.  EXAM: ACUTE ABDOMEN SERIES (ABDOMEN 2 VIEW & CHEST 1 VIEW)  COMPARISON:  Chest x-ray 06/2013 and 05/31/2008  FINDINGS: Examination demonstrates the lungs to be adequately inflated without consolidation or effusion. The cardiomediastinal silhouette and remainder of the thorax is unchanged.  Abdominal pelvic images demonstrate multiple air-filled dilated small bowel loops measuring up to 6 cm in diameter. There are several scattered air-fluid levels. There is no free peritoneal air. There are degenerative changes of the spine and hips.  IMPRESSION: Multiple air-filled moderately dilated small bowel loops with air-fluid levels suggesting distal small bowel obstruction and less likely ileus.  No acute cardiopulmonary disease.   Electronically Signed   By: Marin Olp M.D.   On: 09/21/2013 21:02    Review of Systems  Constitutional: Positive for chills. Negative for weight loss.  HENT: Negative for ear discharge, ear pain, hearing loss and  tinnitus.   Eyes: Negative for blurred vision, double vision, photophobia and pain.  Respiratory: Negative for cough, sputum production and shortness of breath.   Cardiovascular: Negative for chest pain.  Gastrointestinal: Positive for vomiting, abdominal pain and diarrhea. Negative for nausea, constipation and blood in stool.  Genitourinary: Negative for dysuria, urgency, frequency and flank pain.  Musculoskeletal: Negative for back pain, falls, joint pain, myalgias and neck pain.  Neurological: Negative for dizziness, tingling, sensory change, focal weakness, loss of consciousness and headaches.  Endo/Heme/Allergies: Does not bruise/bleed easily.  Psychiatric/Behavioral: Negative for depression, memory loss and substance abuse. The patient is not nervous/anxious.    Blood pressure 132/71, pulse 89, temperature 97.9 F (36.6 C), temperature source Oral, resp. rate 17, height '5\' 8"'  (1.727 m), weight 192 lb 7.4 oz (87.3 kg), SpO2 100.00%. Physical Exam  Vitals reviewed. Constitutional: He is oriented to person, place, and time. He appears well-developed and well-nourished. He is cooperative. No distress. Cervical collar and nasal cannula in place.  HENT:  Head: Normocephalic and atraumatic. Head is without raccoon's eyes, without Battle's sign, without abrasion, without contusion and without laceration.  Right Ear: Hearing, tympanic membrane, external ear and ear canal normal. No lacerations. No drainage or tenderness. No foreign bodies. Tympanic membrane is not perforated. No hemotympanum.  Left Ear: Hearing, tympanic membrane, external ear and ear canal normal. No lacerations. No drainage or tenderness. No foreign bodies. Tympanic membrane is not perforated. No hemotympanum.  Nose: Nose normal. No nose lacerations, sinus tenderness, nasal deformity or nasal septal hematoma. No epistaxis.  Mouth/Throat: Uvula is midline, oropharynx is clear and moist and mucous membranes are normal. No  lacerations.  Eyes: Conjunctivae, EOM and lids are normal. Pupils are equal, round, and reactive to light. No scleral icterus.  Neck: Trachea normal. No JVD present. No spinous process tenderness and no muscular tenderness present. Carotid bruit is not present. No thyromegaly present.  Cardiovascular: Normal rate, regular rhythm, normal heart sounds, intact distal pulses and normal pulses.   Respiratory: Effort normal and breath sounds normal. No respiratory distress. He exhibits no tenderness, no bony tenderness, no laceration and no crepitus.  GI: Soft. Normal appearance. He exhibits distension. He exhibits no mass. Bowel sounds are decreased. There is no tenderness. There is no rigidity, no rebound, no guarding and no CVA tenderness.  Hypoactive bowel sounds  Musculoskeletal: Normal range of motion. He exhibits no edema and no tenderness.  Lymphadenopathy:    He has no cervical adenopathy.  Neurological: He is alert and oriented to person, place, and time. He has normal strength. No cranial nerve deficit or sensory deficit. GCS eye subscore is 4. GCS verbal subscore is 5. GCS motor subscore is 6.  Skin: Skin is warm, dry and intact. He is not diaphoretic.  Psychiatric: He has a normal mood and affect. His speech is normal and behavior is normal.    Assessment/Plan: 80 year old male with a likely acute gastroenteritis and ileus The patient has a normal white count as well as lactate which is not consistent with any small bowel ischemia. I believe his acute renal failure is likely due to dehydration.  1. Recommend continued NG tube to low intermittent wall suction 2. Continue with IV fluid rehydration 3. No surgical plans at this time and we'll continue with serial abdominal exams.  Rosario Jacks., Wei Newbrough 09/22/2013, 8:11 AM

## 2013-09-22 NOTE — Progress Notes (Signed)
NG tube inserted per left nare. Placement verified by this nurse and Wallace CullensBree, RN. Patient tolerated well. Will continue to monitor.

## 2013-09-22 NOTE — Progress Notes (Addendum)
Discussed with Dr. Lovell SheehanJenkins. He feels patient needs [facility] due to the findings on CT concerning for small bowel ischemia.  [facility] Discussed with Dr. Maisie Fushomas with general surgery and she notified Dr. Corliss Skainssuei [facility]. Also discussed with Dr. Allena KatzPatel with [facility].  He is making urine and is on IVF. If renal function doesn't improve he will require nephrology assistance.  Patient remains hemodynamically stable. He is agreeable to transfer. He will notify his daughter.  Will start Vanc and Zosyn.  Almeta Geisel 1:25 AM

## 2013-09-22 NOTE — Progress Notes (Signed)
[facility] has arrived to pick up patient. I.V. Is intact. NG tube intact. Belongings sent with patient which includes glasses, wallet, and clothing. [facility]

## 2013-09-22 NOTE — Progress Notes (Signed)
ANTIBIOTIC CONSULT NOTE - INITIAL  Pharmacy Consult for vancomycin/zosyn Indication: small bowel ischemia  Allergies  Allergen Reactions  . Flomax [Tamsulosin Hcl]     Patient Measurements: Height: 5\' 8"  (172.7 cm) Weight: 192 lb 7.4 oz (87.3 kg) IBW/kg (Calculated) : 68.4  Vital Signs: Temp: 97.9 F (36.6 C) (01/01 0754) Temp src: Oral (01/01 0754) BP: 132/71 mmHg (01/01 0652) Pulse Rate: 89 (01/01 0754) Intake/Output from previous day: 12/31 0701 - 01/01 0700 In: -  Out: 2000 [Urine:150; Emesis/NG output:1850] Intake/Output from this shift:    Labs:  Recent Labs  09/21/13 1925  WBC 6.0  HGB 15.3  PLT 368  CREATININE 4.00*   Estimated Creatinine Clearance: 15.6 ml/min (by C-G formula based on Cr of 4). No results found for this basename: VANCOTROUGH, VANCOPEAK, VANCORANDOM, GENTTROUGH, GENTPEAK, GENTRANDOM, TOBRATROUGH, TOBRAPEAK, TOBRARND, AMIKACINPEAK, AMIKACINTROU, AMIKACIN,  in the last 72 hours   Microbiology: No results found for this or any previous visit (from the past 720 hour(s)).  Medical History: Past Medical History  Diagnosis Date  . Prostate enlargement   . Hypertension   . Hyperlipidemia     Medications:  Prescriptions prior to admission  Medication Sig Dispense Refill  . atorvastatin (LIPITOR) 40 MG tablet Take 40 mg by mouth daily.      Marland Kitchen. dicyclomine (BENTYL) 10 MG capsule Take 10 mg by mouth 4 (four) times daily -  before meals and at bedtime.      . diphenoxylate-atropine (LOMOTIL) 2.5-0.025 MG per tablet Take 1 tablet by mouth as needed for diarrhea or loose stools.       . hydrochlorothiazide (HYDRODIURIL) 25 MG tablet Take 25 mg by mouth daily.      Marland Kitchen. lisinopril (PRINIVIL,ZESTRIL) 40 MG tablet Take 40 mg by mouth daily.       Scheduled:  . ondansetron      . piperacillin-tazobactam  3.375 g Intravenous Once  . potassium chloride  10 mEq Intravenous Once  . vancomycin  1,750 mg Intravenous Once    Assessment: 80 yo noted with  diarrhea, along with vomiting noted with possible small bowel obstruction and also ARF. Patient to begin vancomycin/zosun  for ischemic small bowel. WBC= 6, afebrile, SCr= 4.0, CrCl ~ 15 (no prior SCr is available).  Patient received 1000mg  vancomycin at about 0100 today and zosyn 3.375 at 0200 today.  1/1 vancomycin>> 1/1 zosyn>>   Goal of Therapy:  Vancomycin trough level 15-20 mcg/ml  Plan:  -Will give an additional 750mg  IV vancomycin to complete a 1750mg  load -Vancomycin maintenance dose of 1000mg  IV q48hr -Zosyn 2.25mg  IV q6h -Will follow renal function, cultures and clinical progress  Harland Germanndrew Nimrit Kehres, Pharm D 09/22/2013 9:17 AM

## 2013-09-22 NOTE — Progress Notes (Addendum)
[facility] PROGRESS NOTE  KYMIR COLES ZOX:[id number] DOB: 1930/03/13 DOA: 09/21/2013 PCP: Rudi Heap, MD  Assessment/Plan: 1. Ileus vs SBO- NG tube with wall suction, surgery following. No plans for surgery at this time. Patient has normal lactate level. Will check abdomen xray in am.Will continue with empiric vancomycin and zosyn. 2. AKI- Likely acute, will continue with IV fluids and check BMP in am.Will check renal ultrasound. 3. Hypertension- BP is stable, antihypertensive meds on hold. 4. Hypokalemia- will replace the potassium and check BMP in am.  Code Status: *Full code Family Communication: *Discussed with patient in detail, no family at bedside Disposition Plan: *Home when stable   Consultants:  Surgery  Procedures:  None  Antibiotics:  Vancomycin 1/1  Zosyn 1/1  HPI/Subjective: Patient seen and examined, admitted with SBO, ARF. Has NG tube in place, seen by surgery this morning.  Objective: Filed Vitals:   09/22/13 0754  BP:   Pulse: 89  Temp: 97.9 F (36.6 C)  Resp: 17    Intake/Output Summary (Last 24 hours) at 09/22/13 0817 Last data filed at 09/22/13 0700  Gross per 24 hour  Intake      0 ml  Output   2000 ml  Net  -2000 ml   Filed Weights   09/21/13 1859 09/22/13 0652  Weight: 88.451 kg (195 lb) 87.3 kg (192 lb 7.4 oz)    Exam:  Physical Exam: Head: Normocephalic, atraumatic.  Eyes: No signs of jaundice, EOMI Nose: Mucous membranes dry.  Throat: Oropharynx nonerythematous, no exudate appreciated.  Neck: supple,No deformities, masses, or tenderness noted. Lungs: Normal respiratory effort. B/L Clear to auscultation, no crackles or wheezes.  Heart: Regular RR. S1 and S2 normal  Abdomen: BS normoactive. Soft, Nondistended, non-tender.  Extremities: No pretibial edema, no erythema   Data Reviewed: Basic Metabolic Panel:  Recent Labs Lab 09/21/13 1925  NA 129*  K 3.4*  CL 80*  CO2 24  GLUCOSE 136*  BUN 129*   CREATININE 4.00*  CALCIUM 9.5   Liver Function Tests:  Recent Labs Lab 09/21/13 1925  AST 24  ALT 28  ALKPHOS 60  BILITOT 0.7  PROT 8.1  ALBUMIN 3.9    Recent Labs Lab 09/21/13 1925  LIPASE 61*   No results found for this basename: AMMONIA,  in the last 168 hours CBC:  Recent Labs Lab 09/21/13 1925  WBC 6.0  NEUTROABS 4.1  HGB 15.3  HCT 42.9  MCV 85.3  PLT 368   Cardiac Enzymes: No results found for this basename: CKTOTAL, CKMB, CKMBINDEX, TROPONINI,  in the last 168 hours BNP (last 3 results) No results found for this basename: PROBNP,  in the last 8760 hours CBG: No results found for this basename: GLUCAP,  in the last 168 hours  No results found for this or any previous visit (from the past 240 hour(s)).   Studies: Ct Abdomen Pelvis Wo Contrast  09/21/2013   CLINICAL DATA:  Vomiting and diarrhea for 4 days. Appendectomy. Hypertension.  EXAM: CT ABDOMEN AND PELVIS WITHOUT CONTRAST  TECHNIQUE: Multidetector CT imaging of the abdomen and pelvis was performed following the standard protocol without intravenous contrast.  COMPARISON:  Plain films of earlier in the day.  No prior CT.  FINDINGS: Lower Chest: Clear lung bases. Normal heart size without pericardial or pleural effusion. Multivessel coronary artery atherosclerosis.  Abdomen/Pelvis: Mild hepatic steatosis. Small volume portal venous gas, including within the left lobe of the liver on image 20.  Splenule. Mild gastric distension. Normal  pancreas, gallbladder, biliary tract, adrenal glands. Interpolar right renal lesion which measures fluid density and 1.7 cm, most consistent with a cyst. Smaller bilateral renal lesions which also measure fluid density and are likely cysts. Renal cortical thinning bilaterally.  Aortic and branch vessel atherosclerosis. No retroperitoneal or retrocrural adenopathy. Extensive colonic diverticulosis, including within the sigmoid. Normal terminal ileum.  Small bowel loops are dilated  and fluid-filled. Undergo a relatively gradual transition to normal to decompressed distal small bowel. No abrupt transition identified.  No extraluminal gas identified. Lucencies within mid small bowel loops are suspicious for pneumatosis. Example on image 75/series 2.  Bilateral fat containing inguinal hernias. No pelvic adenopathy. Normal urinary bladder and prostate. No significant free fluid.  Bones/Musculoskeletal: Remote anterior right rib fractures. Degenerative disc disease involves lower lumbar spine.  IMPRESSION: 1. Portal venous gas with proximal and mid small dilatation and suspicion of small volume pneumatosis. Findings are overall suspicious for small bowel ischemia with secondary adynamic ileus. Correlate with lactate levels and clinical exam. These results were called by telephone at the time of interpretation on 09/21/2013 at 11:53 PM to Dr. Bebe ShaggyWickline , who verbally acknowledged these results. 2. Coronary artery atherosclerosis.   Electronically Signed   By: Jeronimo GreavesKyle  Talbot M.D.   On: 09/21/2013 23:55   Dg Abd Acute W/chest  09/21/2013   CLINICAL DATA:  Nausea vomiting and diarrhea for 4 days.  EXAM: ACUTE ABDOMEN SERIES (ABDOMEN 2 VIEW & CHEST 1 VIEW)  COMPARISON:  Chest x-ray 06/2013 and 05/31/2008  FINDINGS: Examination demonstrates the lungs to be adequately inflated without consolidation or effusion. The cardiomediastinal silhouette and remainder of the thorax is unchanged.  Abdominal pelvic images demonstrate multiple air-filled dilated small bowel loops measuring up to 6 cm in diameter. There are several scattered air-fluid levels. There is no free peritoneal air. There are degenerative changes of the spine and hips.  IMPRESSION: Multiple air-filled moderately dilated small bowel loops with air-fluid levels suggesting distal small bowel obstruction and less likely ileus.  No acute cardiopulmonary disease.   Electronically Signed   By: Elberta Fortisaniel  Boyle M.D.   On: 09/21/2013 21:02    Scheduled  Meds: . ondansetron       Continuous Infusions: . sodium chloride 100 mL/hr at 09/22/13 0700    Principal Problem:   SBO (small bowel obstruction) Active Problems:   ARF (acute renal failure)   HTN (hypertension), benign   Hypercholesterolemia   Dehydration   Small bowel ischemia    Time spent: 25 min    [facility],Lucina Betty S  [facility] Pager [phone]. If 7PM-7AM, please contact night-coverage at [email or web address], password [facility] 09/22/2013, 8:17 AM  LOS: 1 day

## 2013-09-22 NOTE — ED Provider Notes (Signed)
12:12 AM Called in ED by radiology. CT with evidence of mesenteric ischemia. Pt already on floor. Called Dr Barnie DelG. Krishnan. Discussed with Dr Lovell SheehanJenkins, surgery as well. Is already coming in to see another pt. [facility]   Raeford RazorStephen Stephanne Greeley, MD 09/22/13 [phone]

## 2013-09-23 ENCOUNTER — Inpatient Hospital Stay (HOSPITAL_COMMUNITY): Payer: Medicare HMO

## 2013-09-23 LAB — COMPREHENSIVE METABOLIC PANEL
ALT: 21 U/L (ref 0–53)
AST: 17 U/L (ref 0–37)
Albumin: 3.1 g/dL — ABNORMAL LOW (ref 3.5–5.2)
Alkaline Phosphatase: 53 U/L (ref 39–117)
BILIRUBIN TOTAL: 0.7 mg/dL (ref 0.3–1.2)
BUN: 94 mg/dL — ABNORMAL HIGH (ref 6–23)
CHLORIDE: 92 meq/L — AB (ref 96–112)
CO2: 27 mEq/L (ref 19–32)
CREATININE: 2.09 mg/dL — AB (ref 0.50–1.35)
Calcium: 8.8 mg/dL (ref 8.4–10.5)
GFR calc Af Amer: 33 mL/min — ABNORMAL LOW (ref >90)
GFR calc non Af Amer: 28 mL/min — ABNORMAL LOW (ref >90)
GLUCOSE: 119 mg/dL — AB (ref 70–99)
Potassium: 3.4 mEq/L — ABNORMAL LOW (ref 3.7–5.3)
Sodium: 138 mEq/L (ref 137–147)
Total Protein: 6.9 g/dL (ref 6.0–8.3)

## 2013-09-23 LAB — CBC
HCT: 41.2 % (ref 39.0–52.0)
Hemoglobin: 14.8 g/dL (ref 13.0–17.0)
MCH: 31.4 pg (ref 26.0–34.0)
MCHC: 35.9 g/dL (ref 30.0–36.0)
MCV: 87.3 fL (ref 78.0–100.0)
Platelets: 324 10*3/uL (ref 150–400)
RBC: 4.72 MIL/uL (ref 4.22–5.81)
RDW: 13.8 % (ref 11.5–15.5)
WBC: 6.6 10*3/uL (ref 4.0–10.5)

## 2013-09-23 MED ORDER — POTASSIUM CHLORIDE CRYS ER 20 MEQ PO TBCR
40.0000 meq | EXTENDED_RELEASE_TABLET | Freq: Once | ORAL | Status: AC
  Administered 2013-09-23: 40 meq via ORAL
  Filled 2013-09-23: qty 2

## 2013-09-23 MED ORDER — PIPERACILLIN-TAZOBACTAM 3.375 G IVPB
3.3750 g | Freq: Three times a day (TID) | INTRAVENOUS | Status: DC
  Filled 2013-09-23 (×2): qty 50

## 2013-09-23 MED ORDER — METRONIDAZOLE IN NACL 5-0.79 MG/ML-% IV SOLN
500.0000 mg | Freq: Three times a day (TID) | INTRAVENOUS | Status: DC
  Administered 2013-09-23 – 2013-09-28 (×16): 500 mg via INTRAVENOUS
  Filled 2013-09-23 (×17): qty 100

## 2013-09-23 MED ORDER — VANCOMYCIN HCL IN DEXTROSE 1-5 GM/200ML-% IV SOLN
1000.0000 mg | INTRAVENOUS | Status: DC
  Filled 2013-09-23: qty 200

## 2013-09-23 MED ORDER — LEVOFLOXACIN IN D5W 750 MG/150ML IV SOLN
750.0000 mg | INTRAVENOUS | Status: DC
  Administered 2013-09-23 – 2013-09-27 (×3): 750 mg via INTRAVENOUS
  Filled 2013-09-23 (×3): qty 150

## 2013-09-23 NOTE — Progress Notes (Signed)
Subjective: Patient comfortably; reports some flatus but no BM in last 24 hours NG large bilious output  Objective: Vital signs in last 24 hours: Temp:  [97.2 F (36.2 C)-98.1 F (36.7 C)] 97.9 F (36.6 C) (01/02 0413) Pulse Rate:  [78-90] 84 (01/02 0413) Resp:  [13-24] 24 (01/02 0413) BP: (98-116)/(51-78) 107/60 mmHg (01/02 0413) SpO2:  [80 %-97 %] 80 % (01/02 0413) Weight:  [192 lb 7.4 oz (87.3 kg)] 192 lb 7.4 oz (87.3 kg) (01/02 0413) Last BM Date: 09/19/13  Intake/Output from previous day: 01/01 0701 - 01/02 0700 In: 1853.3 [I.V.:1503.3; IV Piggyback:350] Out: 3300 [Urine:1050; Emesis/NG output:2250] Intake/Output this shift:    General appearance: alert, cooperative and no distress GI: soft, non-tender; no palpable masses Healed contracted RLQ incision from remote appendectomy  Lab Results:   Recent Labs  09/21/13 1925 09/23/13 0500  WBC 6.0 6.6  HGB 15.3 14.8  HCT 42.9 41.2  PLT 368 324   BMET  Recent Labs  09/21/13 1925 09/23/13 0500  NA 129* 138  K 3.4* 3.4*  CL 80* 92*  CO2 24 27  GLUCOSE 136* 119*  BUN 129* 94*  CREATININE 4.00* 2.09*  CALCIUM 9.5 8.8   PT/INR  Recent Labs  09/22/13 0018  LABPROT 14.0  INR 1.10   ABG No results found for this basename: PHART, PCO2, PO2, HCO3,  in the last 72 hours  Studies/Results: Ct Abdomen Pelvis Wo Contrast  09/21/2013   CLINICAL DATA:  Vomiting and diarrhea for 4 days. Appendectomy. Hypertension.  EXAM: CT ABDOMEN AND PELVIS WITHOUT CONTRAST  TECHNIQUE: Multidetector CT imaging of the abdomen and pelvis was performed following the standard protocol without intravenous contrast.  COMPARISON:  Plain films of earlier in the day.  No prior CT.  FINDINGS: Lower Chest: Clear lung bases. Normal heart size without pericardial or pleural effusion. Multivessel coronary artery atherosclerosis.  Abdomen/Pelvis: Mild hepatic steatosis. Small volume portal venous gas, including within the left lobe of the liver  on image 20.  Splenule. Mild gastric distension. Normal pancreas, gallbladder, biliary tract, adrenal glands. Interpolar right renal lesion which measures fluid density and 1.7 cm, most consistent with a cyst. Smaller bilateral renal lesions which also measure fluid density and are likely cysts. Renal cortical thinning bilaterally.  Aortic and branch vessel atherosclerosis. No retroperitoneal or retrocrural adenopathy. Extensive colonic diverticulosis, including within the sigmoid. Normal terminal ileum.  Small bowel loops are dilated and fluid-filled. Undergo a relatively gradual transition to normal to decompressed distal small bowel. No abrupt transition identified.  No extraluminal gas identified. Lucencies within mid small bowel loops are suspicious for pneumatosis. Example on image 75/series 2.  Bilateral fat containing inguinal hernias. No pelvic adenopathy. Normal urinary bladder and prostate. No significant free fluid.  Bones/Musculoskeletal: Remote anterior right rib fractures. Degenerative disc disease involves lower lumbar spine.  IMPRESSION: 1. Portal venous gas with proximal and mid small dilatation and suspicion of small volume pneumatosis. Findings are overall suspicious for small bowel ischemia with secondary adynamic ileus. Correlate with lactate levels and clinical exam. These results were called by telephone at the time of interpretation on 09/21/2013 at 11:53 PM to Dr. Bebe ShaggyWickline , who verbally acknowledged these results. 2. Coronary artery atherosclerosis.   Electronically Signed   By: Jeronimo GreavesKyle  Talbot M.D.   On: 09/21/2013 23:55   Dg Abd 2 Views  09/22/2013   CLINICAL DATA:  Vomiting with diarrhea for 5 days.  EXAM: ABDOMEN - 2 VIEW  COMPARISON:  Radiographs and CT 09/21/2013.  FINDINGS: Nasogastric tube has been placed into the proximal stomach. There is persistent moderate proximal to mid small bowel distension. The colon remains relatively decompressed. There is mild small bowel wall  thickening. No free intraperitoneal air or portal venous gas is apparent radiographically. Mild lumbar spondylosis is noted.  IMPRESSION: No significant change in proximal small bowel distension following nasogastric tube placement. Pattern remains obstructive, although could be secondary to ischemic ileus as suggested on CT.   Electronically Signed   By: Roxy Horseman M.D.   On: 09/22/2013 12:50   Dg Abd Acute W/chest  09/21/2013   CLINICAL DATA:  Nausea vomiting and diarrhea for 4 days.  EXAM: ACUTE ABDOMEN SERIES (ABDOMEN 2 VIEW & CHEST 1 VIEW)  COMPARISON:  Chest x-ray 06/2013 and 05/31/2008  FINDINGS: Examination demonstrates the lungs to be adequately inflated without consolidation or effusion. The cardiomediastinal silhouette and remainder of the thorax is unchanged.  Abdominal pelvic images demonstrate multiple air-filled dilated small bowel loops measuring up to 6 cm in diameter. There are several scattered air-fluid levels. There is no free peritoneal air. There are degenerative changes of the spine and hips.  IMPRESSION: Multiple air-filled moderately dilated small bowel loops with air-fluid levels suggesting distal small bowel obstruction and less likely ileus.  No acute cardiopulmonary disease.   Electronically Signed   By: Elberta Fortis M.D.   On: 09/21/2013 21:02    Anti-infectives: Anti-infectives   Start     Dose/Rate Route Frequency Ordered Stop   09/24/13 2200  vancomycin (VANCOCIN) IVPB 1000 mg/200 mL premix  Status:  Discontinued     1,000 mg 200 mL/hr over 60 Minutes Intravenous Every 48 hours 09/22/13 0747 09/22/13 0748   09/24/13 1000  vancomycin (VANCOCIN) IVPB 1000 mg/200 mL premix     1,000 mg 200 mL/hr over 60 Minutes Intravenous Every 48 hours 09/22/13 0919     09/22/13 1200  piperacillin-tazobactam (ZOSYN) IVPB 2.25 g     2.25 g 100 mL/hr over 30 Minutes Intravenous 4 times per day 09/22/13 0919     09/22/13 1030  vancomycin (VANCOCIN) 1,750 mg in sodium chloride 0.9 %  500 mL IVPB  Status:  Discontinued     1,750 mg 250 mL/hr over 120 Minutes Intravenous  Once 09/22/13 0907 09/22/13 0915   09/22/13 1030  piperacillin-tazobactam (ZOSYN) IVPB 3.375 g  Status:  Discontinued     3.375 g 100 mL/hr over 30 Minutes Intravenous  Once 09/22/13 0907 09/22/13 0915   09/22/13 1030  vancomycin (VANCOCIN) IVPB 750 mg/150 ml premix     750 mg 150 mL/hr over 60 Minutes Intravenous  Once 09/22/13 0919 09/22/13 1134   09/22/13 1000  piperacillin-tazobactam (ZOSYN) IVPB 2.25 g  Status:  Discontinued     2.25 g 100 mL/hr over 30 Minutes Intravenous Every 6 hours 09/22/13 0746 09/22/13 0748   09/22/13 0200  vancomycin (VANCOCIN) IVPB 1000 mg/200 mL premix     1,000 mg 200 mL/hr over 60 Minutes Intravenous  Once 09/22/13 0153 09/22/13 0320   09/22/13 0015  piperacillin-tazobactam (ZOSYN) IVPB 3.375 g     3.375 g 100 mL/hr over 30 Minutes Intravenous  Once 09/22/13 0001 09/22/13 0142      Assessment/Plan: s/p * No surgery found * Ileus - renal insufficiency likely secondary to gastroenteritis with dehydration Seems to be improving Will check films today Continue NG tube for now.  LOS: 2 days    Sherri Mcarthy K. 09/23/2013

## 2013-09-23 NOTE — Progress Notes (Signed)
[facility] Progress Note [place] TEAM 1 - Stepdown/ICU TEAM   SCHYLER COUNSELL WUJ:[id number] DOB: [birth year] DOA: 09/21/2013 PCP: Rudi Heap, MD  Brief narrative: 80 y/o male with h/o HTN, Hypercholesterolemia who has been having diarrhea and vomiting for almost 1 wk (began last Saturday) along with diffuse abdominal pain and chills. He was found to have severe acute renal failure and PSBO and transferred from [facility] to [facility].   Subjective: Abdominal pain and diarrhea resolved. NG tube present. No other complaints.   Assessment/Plan: Principal Problem:   PSBO/ ileus - cont conservative measures with NG tube and bowel rest - surgery following  Active Problems: Diarrhea/ Vomiting/ pneumatosis - probable viral gastroenteritis but covering for bacterial gastroenteritis as well - change Vanc/Zosyn to Flagyl and Levaquin for appropriate GI coverage - C. Diff negative - CT scan questioning ischemic small bowel but lactic acid normal    ARF (acute renal failure) - likely prerenal and resolving rapidly with IV hydration - hold HCTZ and Lisinopril  Hypokalemia - replace via IV    HTN (hypertension), benign - medications on hold due to hypotension    Hypercholesterolemia - holding Lipitor   Code Status: Full code Family Communication: none Disposition Plan: to be determined  Consultants: surgery  Procedures: none  Antibiotics: Vanc 1/1- 1/2 Zosyn 1/1- 1/2   DVT prophylaxis: SCDs  Objective: Blood pressure 107/60, pulse 84, temperature 97.6 F (36.4 C), temperature source Axillary, resp. rate 24, height 5\' 8"  (1.727 m), weight 87.3 kg (192 lb 7.4 oz), SpO2 98.00%.  Intake/Output Summary (Last 24 hours) at 09/23/13 1220 Last data filed at 09/23/13 0910  Gross per 24 hour  Intake 1328.33 ml  Output   2650 ml  Net -1321.67 ml     Exam: General: No acute respiratory distress Lungs: Clear to auscultation bilaterally without wheezes  or crackles Cardiovascular: Regular rate and rhythm without murmur gallop or rub normal S1 and S2 Abdomen: NG tube draining dark brown liquid- abdomen soft, not significant tenderness or distension Extremities: No significant cyanosis, clubbing, or edema bilateral lower extremities  Data Reviewed: Basic Metabolic Panel:  Recent Labs Lab 09/21/13 1925 09/23/13 0500  NA 129* 138  K 3.4* 3.4*  CL 80* 92*  CO2 24 27  GLUCOSE 136* 119*  BUN 129* 94*  CREATININE 4.00* 2.09*  CALCIUM 9.5 8.8   Liver Function Tests:  Recent Labs Lab 09/21/13 1925 09/23/13 0500  AST 24 17  ALT 28 21  ALKPHOS 60 53  BILITOT 0.7 0.7  PROT 8.1 6.9  ALBUMIN 3.9 3.1*    Recent Labs Lab 09/21/13 1925  LIPASE 61*   No results found for this basename: AMMONIA,  in the last 168 hours CBC:  Recent Labs Lab 09/21/13 1925 09/23/13 0500  WBC 6.0 6.6  NEUTROABS 4.1  --   HGB 15.3 14.8  HCT 42.9 41.2  MCV 85.3 87.3  PLT 368 324   Cardiac Enzymes: No results found for this basename: CKTOTAL, CKMB, CKMBINDEX, TROPONINI,  in the last 168 hours BNP (last 3 results) No results found for this basename: PROBNP,  in the last 8760 hours CBG: No results found for this basename: GLUCAP,  in the last 168 hours  No results found for this or any previous visit (from the past 240 hour(s)).   Studies:  Recent x-ray studies have been reviewed in detail by the Attending Physician  Scheduled Meds:  Scheduled Meds: . levofloxacin (LEVAQUIN) IV  750 mg Intravenous Q48H  .  metronidazole  500 mg Intravenous Q8H   Continuous Infusions: . sodium chloride 100 mL/hr at 09/23/13 0353    Time spent on care of this patient: 35 min   Kyair Ditommaso, MD  [facility] Office  [phone] Pager - Text Page per Loretha StaplerAmion as per below:  On-Call/Text Page:      [email or web address]      password [email or web address]  If 7PM-7AM, please contact night-coverage [email or web address] Password [email or web address] 09/23/2013, 12:21 PM   LOS: 2 days

## 2013-09-23 NOTE — Progress Notes (Addendum)
ANTIBIOTIC CONSULT NOTE - INITIAL  Pharmacy Consult for levaquin Indication: small bowel ischemia  Allergies  Allergen Reactions  . Flomax [Tamsulosin Hcl]     Patient Measurements: Height: 5\' 8"  (172.7 cm) Weight: 192 lb 7.4 oz (87.3 kg) IBW/kg (Calculated) : 68.4  Vital Signs: Temp: 97.9 F (36.6 C) (01/02 0413) Temp src: Oral (01/02 0413) BP: 107/60 mmHg (01/02 0413) Pulse Rate: 84 (01/02 0413) Intake/Output from previous day: 01/01 0701 - 01/02 0700 In: 1853.3 [I.V.:1503.3; IV Piggyback:350] Out: 3300 [Urine:1050; Emesis/NG output:2250] Intake/Output from this shift: Total I/O In: -  Out: 150 [Urine:150]  Labs:  Recent Labs  09/21/13 1925 09/23/13 0500  WBC 6.0 6.6  HGB 15.3 14.8  PLT 368 324  CREATININE 4.00* 2.09*   Estimated Creatinine Clearance: 29.8 ml/min (by C-G formula based on Cr of 2.09). No results found for this basename: VANCOTROUGH, VANCOPEAK, VANCORANDOM, GENTTROUGH, GENTPEAK, GENTRANDOM, TOBRATROUGH, TOBRAPEAK, TOBRARND, AMIKACINPEAK, AMIKACINTROU, AMIKACIN,  in the last 72 hours   Microbiology: No results found for this or any previous visit (from the past 720 hour(s)).  Medical History: Past Medical History  Diagnosis Date  . Prostate enlargement   . Hypertension   . Hyperlipidemia     Medications:  Prescriptions prior to admission  Medication Sig Dispense Refill  . atorvastatin (LIPITOR) 40 MG tablet Take 40 mg by mouth daily.      Marland Kitchen. dicyclomine (BENTYL) 10 MG capsule Take 10 mg by mouth 4 (four) times daily -  before meals and at bedtime.      . diphenoxylate-atropine (LOMOTIL) 2.5-0.025 MG per tablet Take 1 tablet by mouth as needed for diarrhea or loose stools.       . hydrochlorothiazide (HYDRODIURIL) 25 MG tablet Take 25 mg by mouth daily.      Marland Kitchen. lisinopril (PRINIVIL,ZESTRIL) 40 MG tablet Take 40 mg by mouth daily.       Scheduled:  . piperacillin-tazobactam (ZOSYN)  IV  3.375 g Intravenous Q8H  . vancomycin  1,000 mg  Intravenous Q24H    Assessment: 80 yo noted with diarrhea, along with vomiting noted with possible small bowel obstruction and also ARF. Patient started on vancomycin/zosyn  for ischemic small bowel which were d/c'd today, and pharmacy is consulted to dose levaquin.   Patient's WBC remains wnl at 6.6, and he remains afebrile. Renal function has improved: SCr now 2.09, CrCl ~ 30.   1/1 vancomycin>> 1/2 1/1 zosyn>> 1/2 1/2 levaquin >>   Goal of Therapy:  Vancomycin trough level 15-20 mcg/ml  Plan:  -Start levaquin 750 mg IV q 48 hours. -Follow renal function, WBC, fever trend, clinical progress, and length of antibiotic therapy  Bryanna Yim C. Emit Kuenzel, PharmD Clinical Pharmacist-Resident Pager: [phone] Pharmacy: [phone] 09/23/2013 9:16 AM

## 2013-09-24 ENCOUNTER — Inpatient Hospital Stay (HOSPITAL_COMMUNITY): Payer: Medicare HMO

## 2013-09-24 DIAGNOSIS — K559 Vascular disorder of intestine, unspecified: Secondary | ICD-10-CM

## 2013-09-24 DIAGNOSIS — K56609 Unspecified intestinal obstruction, unspecified as to partial versus complete obstruction: Secondary | ICD-10-CM

## 2013-09-24 LAB — CBC
HCT: 40.8 % (ref 39.0–52.0)
Hemoglobin: 14.1 g/dL (ref 13.0–17.0)
MCH: 30.1 pg (ref 26.0–34.0)
MCHC: 34.6 g/dL (ref 30.0–36.0)
MCV: 87.2 fL (ref 78.0–100.0)
PLATELETS: 332 10*3/uL (ref 150–400)
RBC: 4.68 MIL/uL (ref 4.22–5.81)
RDW: 14 % (ref 11.5–15.5)
WBC: 7.1 10*3/uL (ref 4.0–10.5)

## 2013-09-24 LAB — BASIC METABOLIC PANEL
BUN: 72 mg/dL — ABNORMAL HIGH (ref 6–23)
CO2: 29 mEq/L (ref 19–32)
Calcium: 9.2 mg/dL (ref 8.4–10.5)
Chloride: 99 mEq/L (ref 96–112)
Creatinine, Ser: 1.54 mg/dL — ABNORMAL HIGH (ref 0.50–1.35)
GFR, EST AFRICAN AMERICAN: 47 mL/min — AB (ref >90)
GFR, EST NON AFRICAN AMERICAN: 41 mL/min — AB (ref >90)
Glucose, Bld: 94 mg/dL (ref 70–99)
Potassium: 3.4 mEq/L — ABNORMAL LOW (ref 3.7–5.3)
Sodium: 145 mEq/L (ref 137–147)

## 2013-09-24 LAB — TSH: TSH: 0.951 u[IU]/mL (ref 0.350–4.500)

## 2013-09-24 LAB — GLUCOSE, CAPILLARY: Glucose-Capillary: 80 mg/dL (ref 70–99)

## 2013-09-24 NOTE — Progress Notes (Signed)
Patient ID: Miguel Hardy, male   DOB: [birth year], 80 y.o.   MRN: [id number]    Subjective: Pt states he has no belly pain.  Passed a little flatus this morning.  No BM yet  Objective: Vital signs in last 24 hours: Temp:  [97.5 F (36.4 C)-98.5 F (36.9 C)] 98.5 F (36.9 C) (01/03 0435) Pulse Rate:  [70-86] 76 (01/03 0642) Resp:  [15-24] 15 (01/03 0642) BP: (112-143)/(65-87) 138/69 mmHg (01/03 0642) SpO2:  [93 %-99 %] 98 % (01/03 0642) Weight:  [192 lb 10.9 oz (87.4 kg)] 192 lb 10.9 oz (87.4 kg) (01/03 0435) Last BM Date: 09/20/13  Intake/Output from previous day: 01/02 0701 - 01/03 0700 In: 4090 [I.V.:2090; NG/GT:1600; IV Piggyback:400] Out: 2625 [Urine:1825; Emesis/NG output:800] Intake/Output this shift:    PE: Abd: soft, few BS, NGT with bilious output, NT, doesn't seem distended Heart: regular  Lab Results:   Recent Labs  09/23/13 0500 09/24/13 0400  WBC 6.6 7.1  HGB 14.8 14.1  HCT 41.2 40.8  PLT 324 332   BMET  Recent Labs  09/23/13 0500 09/24/13 0400  NA 138 145  K 3.4* 3.4*  CL 92* 99  CO2 27 29  GLUCOSE 119* 94  BUN 94* 72*  CREATININE 2.09* 1.54*  CALCIUM 8.8 9.2   PT/INR  Recent Labs  09/22/13 0018  LABPROT 14.0  INR 1.10   CMP     Component Value Date/Time   NA 145 09/24/2013 0400   K 3.4* 09/24/2013 0400   CL 99 09/24/2013 0400   CO2 29 09/24/2013 0400   GLUCOSE 94 09/24/2013 0400   BUN 72* 09/24/2013 0400   CREATININE 1.54* 09/24/2013 0400   CALCIUM 9.2 09/24/2013 0400   PROT 6.9 09/23/2013 0500   ALBUMIN 3.1* 09/23/2013 0500   AST 17 09/23/2013 0500   ALT 21 09/23/2013 0500   ALKPHOS 53 09/23/2013 0500   BILITOT 0.7 09/23/2013 0500   GFRNONAA 41* 09/24/2013 0400   GFRAA 47* 09/24/2013 0400   Lipase     Component Value Date/Time   LIPASE 61* 09/21/2013 1925       Studies/Results: Dg Abd 2 Views  09/22/2013   CLINICAL DATA:  Vomiting with diarrhea for 5 days.  EXAM: ABDOMEN - 2 VIEW  COMPARISON:  Radiographs and CT 09/21/2013.  FINDINGS:  Nasogastric tube has been placed into the proximal stomach. There is persistent moderate proximal to mid small bowel distension. The colon remains relatively decompressed. There is mild small bowel wall thickening. No free intraperitoneal air or portal venous gas is apparent radiographically. Mild lumbar spondylosis is noted.  IMPRESSION: No significant change in proximal small bowel distension following nasogastric tube placement. Pattern remains obstructive, although could be secondary to ischemic ileus as suggested on CT.   Electronically Signed   By: Roxy HorsemanBill  Veazey M.D.   On: 09/22/2013 12:50   Dg Abd Portable 2v  09/23/2013   CLINICAL DATA:  History of ileus versus small-bowel obstruction  EXAM: PORTABLE ABDOMEN - 2 VIEW  COMPARISON:  09/22/2013  FINDINGS: Nasogastric tube is once again appreciated tip projecting region the proximal stomach. There is no significant change in the moderately dilated loops small bowel. Paucity of gas is appreciated within the colon. There is no evidence of intraperitoneal free air. The osseous structures demonstrate degenerative changes within the lower lumbar spine and mild dextroscoliosis.  IMPRESSION: No significant change in the bowel gas pattern. There is no evidence of intraperitoneal free air. Differential considerations again include small-bowel obstruction  versus an ileus. Continued surveillance evaluation recommended.   Electronically Signed   By: Salome Holmes M.D.   On: 09/23/2013 09:48    Anti-infectives: Anti-infectives   Start     Dose/Rate Route Frequency Ordered Stop   09/24/13 2200  vancomycin (VANCOCIN) IVPB 1000 mg/200 mL premix  Status:  Discontinued     1,000 mg 200 mL/hr over 60 Minutes Intravenous Every 48 hours 09/22/13 0747 09/22/13 0748   09/24/13 1000  vancomycin (VANCOCIN) IVPB 1000 mg/200 mL premix  Status:  Discontinued     1,000 mg 200 mL/hr over 60 Minutes Intravenous Every 48 hours 09/22/13 0919 09/23/13 0906   09/23/13 1400   piperacillin-tazobactam (ZOSYN) IVPB 3.375 g  Status:  Discontinued     3.375 g 12.5 mL/hr over 240 Minutes Intravenous 3 times per day 09/23/13 0907 09/23/13 0951   09/23/13 1100  levofloxacin (LEVAQUIN) IVPB 750 mg     750 mg 100 mL/hr over 90 Minutes Intravenous Every 48 hours 09/23/13 0958     09/23/13 1000  vancomycin (VANCOCIN) IVPB 1000 mg/200 mL premix  Status:  Discontinued     1,000 mg 200 mL/hr over 60 Minutes Intravenous Every 24 hours 09/23/13 0906 09/23/13 0950   09/23/13 1000  metroNIDAZOLE (FLAGYL) IVPB 500 mg     500 mg 100 mL/hr over 60 Minutes Intravenous Every 8 hours 09/23/13 0951     09/22/13 1200  piperacillin-tazobactam (ZOSYN) IVPB 2.25 g  Status:  Discontinued     2.25 g 100 mL/hr over 30 Minutes Intravenous 4 times per day 09/22/13 0919 09/23/13 0907   09/22/13 1030  vancomycin (VANCOCIN) 1,750 mg in sodium chloride 0.9 % 500 mL IVPB  Status:  Discontinued     1,750 mg 250 mL/hr over 120 Minutes Intravenous  Once 09/22/13 0907 09/22/13 0915   09/22/13 1030  piperacillin-tazobactam (ZOSYN) IVPB 3.375 g  Status:  Discontinued     3.375 g 100 mL/hr over 30 Minutes Intravenous  Once 09/22/13 0907 09/22/13 0915   09/22/13 1030  vancomycin (VANCOCIN) IVPB 750 mg/150 ml premix     750 mg 150 mL/hr over 60 Minutes Intravenous  Once 09/22/13 0919 09/22/13 1134   09/22/13 1000  piperacillin-tazobactam (ZOSYN) IVPB 2.25 g  Status:  Discontinued     2.25 g 100 mL/hr over 30 Minutes Intravenous Every 6 hours 09/22/13 0746 09/22/13 0748   09/22/13 0200  vancomycin (VANCOCIN) IVPB 1000 mg/200 mL premix     1,000 mg 200 mL/hr over 60 Minutes Intravenous  Once 09/22/13 0153 09/22/13 0320   09/22/13 0015  piperacillin-tazobactam (ZOSYN) IVPB 3.375 g     3.375 g 100 mL/hr over 30 Minutes Intravenous  Once 09/22/13 0001 09/22/13 0142       Assessment/Plan  1. Portal venous gas, unknown cause 2. Ileus vs PSBO Patient Active Problem List   Diagnosis Date Noted  . Small  bowel ischemia 09/22/2013  . SBO (small bowel obstruction) 09/21/2013  . ARF (acute renal failure) 09/21/2013  . HTN (hypertension), benign 09/21/2013  . Hypercholesterolemia 09/21/2013  . Dehydration 09/21/2013   Plan: 1. Will recheck some films today as yesterday's showed no significant change.  He did pass flatus overnight.  NGT output still around 800cc yesterday.   2. Cont NGT for now and await films. 3. Unclear what portal venous gas was secondary to on his CT scan.    LOS: 3 days    Zaydon Kinser E 09/24/2013, 8:00 AM Pager: [phone]

## 2013-09-24 NOTE — Progress Notes (Signed)
Pt seen examined and discussed with Hospitalist this am.  Agree with PA plan.  Films pending.  Repeat in am.

## 2013-09-24 NOTE — Progress Notes (Signed)
[facility] Progress Note [place] TEAM 1 - Stepdown/ICU TEAM   Miguel Hardy ZOX:[id number] DOB: 08/04/1930 DOA: 09/21/2013 PCP: Rudi Heap, MD  Brief narrative: 80 y/o male with h/o HTN, Hypercholesterolemia who has been having diarrhea and vomiting for almost 1 wk (began last Saturday) along with diffuse abdominal pain and chills. He was found to have severe acute renal failure and PSBO and transferred from [facility] to [facility].   Subjective: Some retching this AM otherwise no new complaints.   Assessment/Plan: Principal Problem:   PSBO/ ileus - cont conservative measures with NG tube and bowel rest - surgery following  Active Problems:  Diarrhea/ Vomiting/ pneumatosis- likely small bowel ischemic event - change Vanc/Zosyn to Flagyl and Levaquin for appropriate GI coverage in case of translocation of bacteria - CT scan questioning ischemic small bowel but lactic acid normal - will need CTA when renal function improves - C. Diff negative    ARF (acute renal failure) - likely prerenal and resolving rapidly with IV hydration- cont fluids at current rate as he is NPO - hold HCTZ and Lisinopril  Hypokalemia - replace via IV - check Mg and replace if needed    HTN (hypertension), benign - medications on hold due to hypotension    Hypercholesterolemia - holding Lipitor   Code Status: Full code Family Communication: none Disposition Plan: to be determined  Consultants: surgery  Procedures: none  Antibiotics: Vanc 1/1- 1/2 Zosyn 1/1- 1/2   DVT prophylaxis: SCDs  Objective: Blood pressure 143/69, pulse 73, temperature 98.1 F (36.7 C), temperature source Axillary, resp. rate 14, height 5\' 8"  (1.727 m), weight 87.4 kg (192 lb 10.9 oz), SpO2 98.00%.  Intake/Output Summary (Last 24 hours) at 09/24/13 1356 Last data filed at 09/24/13 0643  Gross per 24 hour  Intake   3890 ml  Output   2275 ml  Net   1615 ml     Exam: General: No  acute respiratory distress Lungs: Clear to auscultation bilaterally without wheezes or crackles Cardiovascular: Regular rate and rhythm without murmur gallop or rub normal S1 and S2 Abdomen: NG tube draining dark brown liquid- abdomen soft, not significant tenderness or distension Extremities: No significant cyanosis, clubbing, or edema bilateral lower extremities  Data Reviewed: Basic Metabolic Panel:  Recent Labs Lab 09/21/13 1925 09/23/13 0500 09/24/13 0400  NA 129* 138 145  K 3.4* 3.4* 3.4*  CL 80* 92* 99  CO2 24 27 29   GLUCOSE 136* 119* 94  BUN 129* 94* 72*  CREATININE 4.00* 2.09* 1.54*  CALCIUM 9.5 8.8 9.2   Liver Function Tests:  Recent Labs Lab 09/21/13 1925 09/23/13 0500  AST 24 17  ALT 28 21  ALKPHOS 60 53  BILITOT 0.7 0.7  PROT 8.1 6.9  ALBUMIN 3.9 3.1*    Recent Labs Lab 09/21/13 1925  LIPASE 61*   No results found for this basename: AMMONIA,  in the last 168 hours CBC:  Recent Labs Lab 09/21/13 1925 09/23/13 0500 09/24/13 0400  WBC 6.0 6.6 7.1  NEUTROABS 4.1  --   --   HGB 15.3 14.8 14.1  HCT 42.9 41.2 40.8  MCV 85.3 87.3 87.2  PLT 368 324 332   Cardiac Enzymes: No results found for this basename: CKTOTAL, CKMB, CKMBINDEX, TROPONINI,  in the last 168 hours BNP (last 3 results) No results found for this basename: PROBNP,  in the last 8760 hours CBG:  Recent Labs Lab 09/24/13 0804  GLUCAP 80    No  results found for this or any previous visit (from the past 240 hour(s)).   Studies:  Recent x-ray studies have been reviewed in detail by the Attending Physician  Scheduled Meds:  Scheduled Meds: . levofloxacin (LEVAQUIN) IV  750 mg Intravenous Q48H  . metronidazole  500 mg Intravenous Q8H   Continuous Infusions: . sodium chloride 100 mL/hr at 09/24/13 [id number]    Time spent on care of this patient: 35 min   Christophor Eick, MD  [facility] Office  [phone] Pager - Text Page per Loretha StaplerAmion as per below:  On-Call/Text  Page:      [email or web address]      password [email or web address]  If 7PM-7AM, please contact night-coverage [email or web address] Password [email or web address] 09/24/2013, 1:56 PM   LOS: 3 days

## 2013-09-25 ENCOUNTER — Inpatient Hospital Stay (HOSPITAL_COMMUNITY): Payer: Medicare HMO

## 2013-09-25 LAB — BASIC METABOLIC PANEL
BUN: 54 mg/dL — ABNORMAL HIGH (ref 6–23)
CO2: 28 mEq/L (ref 19–32)
Calcium: 9.1 mg/dL (ref 8.4–10.5)
Chloride: 103 mEq/L (ref 96–112)
Creatinine, Ser: 1.35 mg/dL (ref 0.50–1.35)
GFR, EST AFRICAN AMERICAN: 55 mL/min — AB (ref >90)
GFR, EST NON AFRICAN AMERICAN: 48 mL/min — AB (ref >90)
Glucose, Bld: 103 mg/dL — ABNORMAL HIGH (ref 70–99)
POTASSIUM: 3.6 meq/L — AB (ref 3.7–5.3)
Sodium: 148 mEq/L — ABNORMAL HIGH (ref 137–147)

## 2013-09-25 LAB — MAGNESIUM: Magnesium: 2.3 mg/dL (ref 1.5–2.5)

## 2013-09-25 MED ORDER — CHLORHEXIDINE GLUCONATE 0.12 % MT SOLN
15.0000 mL | Freq: Two times a day (BID) | OROMUCOSAL | Status: DC
  Administered 2013-09-26 (×2): 15 mL via OROMUCOSAL
  Filled 2013-09-25 (×2): qty 15

## 2013-09-25 MED ORDER — POTASSIUM CHLORIDE 10 MEQ/100ML IV SOLN
10.0000 meq | INTRAVENOUS | Status: AC
  Administered 2013-09-25 (×5): 10 meq via INTRAVENOUS
  Filled 2013-09-25: qty 100

## 2013-09-25 MED ORDER — HYDRALAZINE HCL 20 MG/ML IJ SOLN
10.0000 mg | Freq: Four times a day (QID) | INTRAMUSCULAR | Status: DC
  Administered 2013-09-25 – 2013-09-27 (×7): 10 mg via INTRAVENOUS
  Filled 2013-09-25 (×2): qty 0.5
  Filled 2013-09-25: qty 1
  Filled 2013-09-25 (×4): qty 0.5
  Filled 2013-09-25: qty 1
  Filled 2013-09-25 (×3): qty 0.5
  Filled 2013-09-25: qty 1
  Filled 2013-09-25 (×3): qty 0.5

## 2013-09-25 MED ORDER — DEXTROSE 5 % IV SOLN
INTRAVENOUS | Status: DC
  Administered 2013-09-25 – 2013-09-27 (×5): via INTRAVENOUS

## 2013-09-25 MED ORDER — BIOTENE DRY MOUTH MT LIQD
15.0000 mL | Freq: Two times a day (BID) | OROMUCOSAL | Status: DC
  Administered 2013-09-25 – 2013-09-26 (×3): 15 mL via OROMUCOSAL

## 2013-09-25 NOTE — Progress Notes (Signed)
Pt transferred to 6N31.  NG clamped and pt states only c/o pain is sore throat.  Family at bedside.

## 2013-09-25 NOTE — Progress Notes (Signed)
Seen, agree with above.  Check films, may d/c NGT.

## 2013-09-25 NOTE — Progress Notes (Signed)
Patient ID: Miguel HeirBobby L Hardy, male   DOB: 1929-12-12, 80 y.o.   MRN: [id number]    Subjective: Pt feels ok this morning.  NGT hasn't worked all night since last night at 10pm.  Got it working this morning and only had 150cc of residual.  Passed some flatus last night.  Objective: Vital signs in last 24 hours: Temp:  [98 F (36.7 C)-98.5 F (36.9 C)] 98 F (36.7 C) (01/04 0419) Pulse Rate:  [73-84] 79 (01/04 0419) Resp:  [12-21] 15 (01/04 0419) BP: (135-153)/(69-77) 143/70 mmHg (01/04 0419) SpO2:  [98 %] 98 % (01/03 1548) Weight:  [190 lb 0.6 oz (86.2 kg)] 190 lb 0.6 oz (86.2 kg) (01/04 0419) Last BM Date: 09/21/13  Intake/Output from previous day: 01/03 0701 - 01/04 0700 In: 4268.3 [P.O.:90; I.V.:2328.3; NG/GT:1650; IV Piggyback:200] Out: 500 [Urine:500] Intake/Output this shift:    PE: Abd: soft, NT, ND, Few BS, NGT with only 150cc of bilious residual   Lab Results:   Recent Labs  09/23/13 0500 09/24/13 0400  WBC 6.6 7.1  HGB 14.8 14.1  HCT 41.2 40.8  PLT 324 332   BMET  Recent Labs  09/24/13 0400 09/25/13 0645  NA 145 148*  K 3.4* 3.6*  CL 99 103  CO2 29 28  GLUCOSE 94 103*  BUN 72* 54*  CREATININE 1.54* 1.35  CALCIUM 9.2 9.1   PT/INR No results found for this basename: LABPROT, INR,  in the last 72 hours CMP     Component Value Date/Time   NA 148* 09/25/2013 0645   K 3.6* 09/25/2013 0645   CL 103 09/25/2013 0645   CO2 28 09/25/2013 0645   GLUCOSE 103* 09/25/2013 0645   BUN 54* 09/25/2013 0645   CREATININE 1.35 09/25/2013 0645   CALCIUM 9.1 09/25/2013 0645   PROT 6.9 09/23/2013 0500   ALBUMIN 3.1* 09/23/2013 0500   AST 17 09/23/2013 0500   ALT 21 09/23/2013 0500   ALKPHOS 53 09/23/2013 0500   BILITOT 0.7 09/23/2013 0500   GFRNONAA 48* 09/25/2013 0645   GFRAA 55* 09/25/2013 0645   Lipase     Component Value Date/Time   LIPASE 61* 09/21/2013 1925       Studies/Results: Dg Abd 2 Views  09/24/2013   CLINICAL DATA:  Ileus versus partial small bowel obstruction, no  history of surgery, no abdominal pain  EXAM: ABDOMEN - 2 VIEW  COMPARISON:  09/23/2013  FINDINGS: NG tube projects over the a stomach. There are several dilated loops of mid abdominal small bowel showing air-fluid levels. There is minimal gas distally. Small bowel is distended to a maximal diameter of about 5.2 cm. When compared to the prior study this is not significantly different. There is some gas seen into the rectum.  IMPRESSION: Very similar appearance to the prior study with findings suggesting either partial small bowel obstruction or ileus, with obstruction considered more likely.   Electronically Signed   By: Esperanza Heiraymond  Rubner M.D.   On: 09/24/2013 15:54   Dg Abd Portable 2v  09/23/2013   CLINICAL DATA:  History of ileus versus small-bowel obstruction  EXAM: PORTABLE ABDOMEN - 2 VIEW  COMPARISON:  09/22/2013  FINDINGS: Nasogastric tube is once again appreciated tip projecting region the proximal stomach. There is no significant change in the moderately dilated loops small bowel. Paucity of gas is appreciated within the colon. There is no evidence of intraperitoneal free air. The osseous structures demonstrate degenerative changes within the lower lumbar spine and mild dextroscoliosis.  IMPRESSION: No significant change in the bowel gas pattern. There is no evidence of intraperitoneal free air. Differential considerations again include small-bowel obstruction versus an ileus. Continued surveillance evaluation recommended.   Electronically Signed   By: Salome Holmes M.D.   On: 09/23/2013 09:48    Anti-infectives: Anti-infectives   Start     Dose/Rate Route Frequency Ordered Stop   09/24/13 2200  vancomycin (VANCOCIN) IVPB 1000 mg/200 mL premix  Status:  Discontinued     1,000 mg 200 mL/hr over 60 Minutes Intravenous Every 48 hours 09/22/13 0747 09/22/13 0748   09/24/13 1000  vancomycin (VANCOCIN) IVPB 1000 mg/200 mL premix  Status:  Discontinued     1,000 mg 200 mL/hr over 60 Minutes Intravenous  Every 48 hours 09/22/13 0919 09/23/13 0906   09/23/13 1400  piperacillin-tazobactam (ZOSYN) IVPB 3.375 g  Status:  Discontinued     3.375 g 12.5 mL/hr over 240 Minutes Intravenous 3 times per day 09/23/13 0907 09/23/13 0951   09/23/13 1100  levofloxacin (LEVAQUIN) IVPB 750 mg     750 mg 100 mL/hr over 90 Minutes Intravenous Every 48 hours 09/23/13 0958     09/23/13 1000  vancomycin (VANCOCIN) IVPB 1000 mg/200 mL premix  Status:  Discontinued     1,000 mg 200 mL/hr over 60 Minutes Intravenous Every 24 hours 09/23/13 0906 09/23/13 0950   09/23/13 1000  metroNIDAZOLE (FLAGYL) IVPB 500 mg     500 mg 100 mL/hr over 60 Minutes Intravenous Every 8 hours 09/23/13 0951     09/22/13 1200  piperacillin-tazobactam (ZOSYN) IVPB 2.25 g  Status:  Discontinued     2.25 g 100 mL/hr over 30 Minutes Intravenous 4 times per day 09/22/13 0919 09/23/13 0907   09/22/13 1030  vancomycin (VANCOCIN) 1,750 mg in sodium chloride 0.9 % 500 mL IVPB  Status:  Discontinued     1,750 mg 250 mL/hr over 120 Minutes Intravenous  Once 09/22/13 0907 09/22/13 0915   09/22/13 1030  piperacillin-tazobactam (ZOSYN) IVPB 3.375 g  Status:  Discontinued     3.375 g 100 mL/hr over 30 Minutes Intravenous  Once 09/22/13 0907 09/22/13 0915   09/22/13 1030  vancomycin (VANCOCIN) IVPB 750 mg/150 ml premix     750 mg 150 mL/hr over 60 Minutes Intravenous  Once 09/22/13 0919 09/22/13 1134   09/22/13 1000  piperacillin-tazobactam (ZOSYN) IVPB 2.25 g  Status:  Discontinued     2.25 g 100 mL/hr over 30 Minutes Intravenous Every 6 hours 09/22/13 0746 09/22/13 0748   09/22/13 0200  vancomycin (VANCOCIN) IVPB 1000 mg/200 mL premix     1,000 mg 200 mL/hr over 60 Minutes Intravenous  Once 09/22/13 0153 09/22/13 0320   09/22/13 0015  piperacillin-tazobactam (ZOSYN) IVPB 3.375 g     3.375 g 100 mL/hr over 30 Minutes Intravenous  Once 09/22/13 0001 09/22/13 0142       Assessment/Plan  1. Portal venous gas, unknown cause 2. PSBO, seems  improving clinically  Plan: 1. Given NGT not working last night it was essentially working as a clamping trial.  He only had 150cc of residual output this morning.  Will check a film today, but otherwise leave him clamped.  Can hopefully dc NGT soon.     LOS: 4 days    Senan Urey E 09/25/2013, 8:31 AM Pager: [phone]

## 2013-09-25 NOTE — Progress Notes (Signed)
[facility] Progress Note [place] TEAM 1 - Stepdown/ICU TEAM   [place] HeirBobby L Strausser UXL:[id number] DOB: Mar 22, 1930 DOA: 09/21/2013 PCP: Rudi HeapMOORE, DONALD, MD  Brief narrative41: 80 y/o male with h/o HTN, Hypercholesterolemia who has been having diarrhea and vomiting for almost 1 wk (began last Saturday) along with diffuse abdominal pain and chills. He was found to have severe acute renal failure and PSBO and [facility]   Subjective: Some retching this AM otherwise no new complaints.   Assessment/Plan: Principal Problem:   PSBO/ ileus - cont conservative measures with NG tube and bowel rest - surgery following- NG was not working overnight but pt without symptoms therefore tube clamped today - repeat Xray ordered by surgery reveals air fluid levels are persistent - also, NG is in GE junction- advance NG and resume suction? Will allow surgery to manage   Active Problems:  Diarrhea/ Vomiting/ pneumatosis- likely small bowel ischemic event - change Vanc/Zosyn to Flagyl and Levaquin for appropriate GI coverage in case of translocation of bacteria - CT scan questioning ischemic small bowel but lactic acid normal- may have had a small event and recovered quickly  - will need CTA when renal function improves - C. Diff negative    ARF (acute renal failure) - likely prerenal and resolving rapidly with IV hydration- cont fluids at current rate as he is NPO - hold HCTZ and Lisinopril  Hypernatremia - change fluids to D5 W today and follow  Hypokalemia - replace via IV -  Mg normal    HTN (hypertension), benign - medications were on hold due to hypotension - BP rising - will add  IV Hydralazine    Hypercholesterolemia - holding Lipitor   Code Status: Full code Family Communication: none Disposition Plan: to be determined  Consultants: surgery  Procedures: none  Antibiotics: Vanc 1/1- 1/2 Zosyn 1/1- 1/2 Levaquin 1/2 Flagyl  1/2   DVT prophylaxis: SCDs  Objective: Blood pressure 153/70, pulse 51, temperature 97.5 F (36.4 C), temperature source Oral, resp. rate 18, height 5' 8.5" (1.74 m), weight 86.4 kg (190 lb 7.6 oz), SpO2 100.00%.  Intake/Output Summary (Last 24 hours) at 09/25/13 1718 Last data filed at 09/25/13 1709  Gross per 24 hour  Intake   3590 ml  Output   1450 ml  Net   2140 ml     Exam: General: No acute respiratory distress Lungs: Clear to auscultation bilaterally without wheezes or crackles Cardiovascular: Regular rate and rhythm without murmur gallop or rub normal S1 and S2 Abdomen: NG tube draining dark brown liquid- abdomen soft, not significant tenderness or distension Extremities: No significant cyanosis, clubbing, or edema bilateral lower extremities  Data Reviewed: Basic Metabolic Panel:  Recent Labs Lab 09/21/13 1925 09/23/13 0500 09/24/13 0400 09/25/13 0645  NA 129* 138 145 148*  K 3.4* 3.4* 3.4* 3.6*  CL 80* 92* 99 103  CO2 24 27 29 28   GLUCOSE 136* 119* 94 103*  BUN 129* 94* 72* 54*  CREATININE 4.00* 2.09* 1.54* 1.35  CALCIUM 9.5 8.8 9.2 9.1  MG  --   --   --  2.3   Liver Function Tests:  Recent Labs Lab 09/21/13 1925 09/23/13 0500  AST 24 17  ALT 28 21  ALKPHOS 60 53  BILITOT 0.7 0.7  PROT 8.1 6.9  ALBUMIN 3.9 3.1*    Recent Labs Lab 09/21/13 1925  LIPASE 61*   No results found for this basename: AMMONIA,  in the last 168  hours CBC:  Recent Labs Lab 09/21/13 1925 09/23/13 0500 09/24/13 0400  WBC 6.0 6.6 7.1  NEUTROABS 4.1  --   --   HGB 15.3 14.8 14.1  HCT 42.9 41.2 40.8  MCV 85.3 87.3 87.2  PLT 368 324 332   Cardiac Enzymes: No results found for this basename: CKTOTAL, CKMB, CKMBINDEX, TROPONINI,  in the last 168 hours BNP (last 3 results) No results found for this basename: PROBNP,  in the last 8760 hours CBG:  Recent Labs Lab 09/24/13 0804  GLUCAP 80    No results found for this or any previous visit (from the past  240 hour(s)).   Studies:  Recent x-ray studies have been reviewed in detail by the Attending Physician  Scheduled Meds:  Scheduled Meds: . antiseptic oral rinse  15 mL Mouth Rinse q12n4p  . chlorhexidine  15 mL Mouth Rinse BID  . levofloxacin (LEVAQUIN) IV  750 mg Intravenous Q48H  . metronidazole  500 mg Intravenous Q8H   Continuous Infusions: . dextrose 100 mL/hr at 09/25/13 1430    Time spent on care of this patient: 35 min   Jaliah Foody, MD  [facility] Office  [phone] Pager - Text Page per Loretha Stapler as per below:  On-Call/Text Page:      [email or web address]      password [email or web address]  If 7PM-7AM, please contact night-coverage [email or web address] Password [email or web address] 09/25/2013, 5:18 PM   LOS: 4 days

## 2013-09-26 DIAGNOSIS — R12 Heartburn: Secondary | ICD-10-CM

## 2013-09-26 DIAGNOSIS — E7800 Pure hypercholesterolemia, unspecified: Secondary | ICD-10-CM

## 2013-09-26 LAB — BASIC METABOLIC PANEL
BUN: 44 mg/dL — AB (ref 6–23)
CALCIUM: 9 mg/dL (ref 8.4–10.5)
CO2: 29 mEq/L (ref 19–32)
Chloride: 104 mEq/L (ref 96–112)
Creatinine, Ser: 1.34 mg/dL (ref 0.50–1.35)
GFR calc Af Amer: 56 mL/min — ABNORMAL LOW (ref >90)
GFR calc non Af Amer: 48 mL/min — ABNORMAL LOW (ref >90)
GLUCOSE: 162 mg/dL — AB (ref 70–99)
Potassium: 4 mEq/L (ref 3.7–5.3)
Sodium: 146 mEq/L (ref 137–147)

## 2013-09-26 MED ORDER — PANTOPRAZOLE SODIUM 40 MG PO TBEC
40.0000 mg | DELAYED_RELEASE_TABLET | Freq: Every day | ORAL | Status: DC
Start: 2013-09-26 — End: 2013-09-28
  Administered 2013-09-26 – 2013-09-28 (×3): 40 mg via ORAL
  Filled 2013-09-26 (×3): qty 1

## 2013-09-26 MED ORDER — ALUM & MAG HYDROXIDE-SIMETH 200-200-20 MG/5ML PO SUSP
30.0000 mL | ORAL | Status: DC | PRN
  Administered 2013-09-26: 30 mL via ORAL
  Filled 2013-09-26: qty 30

## 2013-09-26 NOTE — Progress Notes (Signed)
Patient ID: Miguel Hardy  male  OAC:[id number]    DOB: [birth year]    DOA: 09/21/2013  PCP: Rudi Heap, MD  Assessment/Plan:  Principal Problem: PSBO/ ileus : Improving, no abdominal pain, passing flatus - No nausea vomiting with NGT clamp, per surgery DC NGT and tried clears  Active Problems:  Diarrhea/ Vomiting/ pneumatosis- likely small bowel ischemic event  - C. Diff negative, continue Levaquin and Flagyl   ARF (acute renal failure)  - likely prerenal and resolving rapidly with IV hydration - hold HCTZ and Lisinopril   Hypernatremia  - Improving   Hypokalemia  - Resolved - Mg normal   HTN (hypertension), benign stable  Hypercholesterolemia  - holding Lipitor   DVT Prophylaxis:  Code Status:  Family Communication:  Disposition: When okayed by surgery    Subjective: Feels better, at the time of my examination, wanted NG tube out, no nausea vomiting  Objective: Weight change: 0.2 kg (7.1 oz)  Intake/Output Summary (Last 24 hours) at 09/26/13 1440 Last data filed at 09/26/13 1300  Gross per 24 hour  Intake   1100 ml  Output    950 ml  Net    150 ml   Blood pressure 146/63, pulse 77, temperature 98.3 F (36.8 C), temperature source Oral, resp. rate 18, height 5' 8.5" (1.74 m), weight 86.4 kg (190 lb 7.6 oz), SpO2 97.00%.  Physical Exam: General: Alert and awake, oriented x3, not in any acute distress. CVS: S1-S2 clear, no murmur rubs or gallops Chest: clear to auscultation bilaterally Abdomen: soft nontender, nondistended, normal bowel sounds  Extremities: no cyanosis, clubbing or edema noted bilaterally Neuro: Cranial nerves II-XII intact, no focal neurological deficits  Lab Results: Basic Metabolic Panel:  Recent Labs Lab 09/25/13 0645 09/26/13 0528  NA 148* 146  K 3.6* 4.0  CL 103 104  CO2 28 29  GLUCOSE 103* 162*  BUN 54* 44*  CREATININE 1.35 1.34  CALCIUM 9.1 9.0  MG 2.3  --    Liver Function Tests:  Recent Labs Lab  09/21/13 1925 09/23/13 0500  AST 24 17  ALT 28 21  ALKPHOS 60 53  BILITOT 0.7 0.7  PROT 8.1 6.9  ALBUMIN 3.9 3.1*    Recent Labs Lab 09/21/13 1925  LIPASE 61*   No results found for this basename: AMMONIA,  in the last 168 hours CBC:  Recent Labs Lab 09/21/13 1925 09/23/13 0500 09/24/13 0400  WBC 6.0 6.6 7.1  NEUTROABS 4.1  --   --   HGB 15.3 14.8 14.1  HCT 42.9 41.2 40.8  MCV 85.3 87.3 87.2  PLT 368 324 332   Cardiac Enzymes: No results found for this basename: CKTOTAL, CKMB, CKMBINDEX, TROPONINI,  in the last 168 hours BNP: No components found with this basename: POCBNP,  CBG:  Recent Labs Lab 09/24/13 0804  GLUCAP 80     Micro Results: No results found for this or any previous visit (from the past 240 hour(s)).  Studies/Results: Ct Abdomen Pelvis Wo Contrast  09/21/2013   CLINICAL DATA:  Vomiting and diarrhea for 4 days. Appendectomy. Hypertension.  EXAM: CT ABDOMEN AND PELVIS WITHOUT CONTRAST  TECHNIQUE: Multidetector CT imaging of the abdomen and pelvis was performed following the standard protocol without intravenous contrast.  COMPARISON:  Plain films of earlier in the day.  No prior CT.  FINDINGS: Lower Chest: Clear lung bases. Normal heart size without pericardial or pleural effusion. Multivessel coronary artery atherosclerosis.  Abdomen/Pelvis: Mild hepatic steatosis. Small volume portal venous gas,  including within the left lobe of the liver on image 20.  Splenule. Mild gastric distension. Normal pancreas, gallbladder, biliary tract, adrenal glands. Interpolar right renal lesion which measures fluid density and 1.7 cm, most consistent with a cyst. Smaller bilateral renal lesions which also measure fluid density and are likely cysts. Renal cortical thinning bilaterally.  Aortic and branch vessel atherosclerosis. No retroperitoneal or retrocrural adenopathy. Extensive colonic diverticulosis, including within the sigmoid. Normal terminal ileum.  Small bowel  loops are dilated and fluid-filled. Undergo a relatively gradual transition to normal to decompressed distal small bowel. No abrupt transition identified.  No extraluminal gas identified. Lucencies within mid small bowel loops are suspicious for pneumatosis. Example on image 75/series 2.  Bilateral fat containing inguinal hernias. No pelvic adenopathy. Normal urinary bladder and prostate. No significant free fluid.  Bones/Musculoskeletal: Remote anterior right rib fractures. Degenerative disc disease involves lower lumbar spine.  IMPRESSION: 1. Portal venous gas with proximal and mid small dilatation and suspicion of small volume pneumatosis. Findings are overall suspicious for small bowel ischemia with secondary adynamic ileus. Correlate with lactate levels and clinical exam. These results were called by telephone at the time of interpretation on 09/21/2013 at 11:53 PM to Dr. Bebe Shaggy , who verbally acknowledged these results. 2. Coronary artery atherosclerosis.   Electronically Signed   By: Jeronimo Greaves M.D.   On: 09/21/2013 23:55   Dg Abd 2 Views  09/25/2013   CLINICAL DATA:  Small bowel obstruction.  Followup.  Hypertension.  EXAM: ABDOMEN - 2 VIEW  COMPARISON:  DG ABD 2 VIEWS dated 09/24/2013; DG ABD 2 VIEWS dated 09/22/2013; CT ABD/PELV WO CM dated 09/21/2013  FINDINGS: Upright and supine views. The upright view demonstrates multiple small bowel air-fluid levels. No free intraperitoneal air. Nasogastric tube with tip at gastric body. Side port above the gastroesophageal junction.  Right hemidiaphragm elevation.  Supine view demonstrates small bowel dilatation at up to 4.3 cm. Slightly increased from 4.0 cm on the prior. No plain film evidence of pneumatosis and no free intraperitoneal air. Previously described portal venous gas is not readily apparent.  IMPRESSION: Slight increase in small bowel dilatation and air-fluid levels. Findings suggest partial small bowel obstruction.  Nasogastric tube with side port  above the gastroesophageal junction. Consider advancement.   Electronically Signed   By: Jeronimo Greaves M.D.   On: 09/25/2013 16:17   Dg Abd 2 Views  09/24/2013   CLINICAL DATA:  Ileus versus partial small bowel obstruction, no history of surgery, no abdominal pain  EXAM: ABDOMEN - 2 VIEW  COMPARISON:  09/23/2013  FINDINGS: NG tube projects over the a stomach. There are several dilated loops of mid abdominal small bowel showing air-fluid levels. There is minimal gas distally. Small bowel is distended to a maximal diameter of about 5.2 cm. When compared to the prior study this is not significantly different. There is some gas seen into the rectum.  IMPRESSION: Very similar appearance to the prior study with findings suggesting either partial small bowel obstruction or ileus, with obstruction considered more likely.   Electronically Signed   By: Esperanza [name] M.D.   On: 09/24/2013 15:54   Dg Abd 2 Views  09/22/2013   CLINICAL DATA:  Vomiting with diarrhea for 5 days.  EXAM: ABDOMEN - 2 VIEW  COMPARISON:  Radiographs and CT 09/21/2013.  FINDINGS: Nasogastric tube has been placed into the proximal stomach. There is persistent moderate proximal to mid small bowel distension. The colon remains relatively decompressed. There is mild  small bowel wall thickening. No free intraperitoneal air or portal venous gas is apparent radiographically. Mild lumbar spondylosis is noted.  IMPRESSION: No significant change in proximal small bowel distension following nasogastric tube placement. Pattern remains obstructive, although could be secondary to ischemic ileus as suggested on CT.   Electronically Signed   By: Roxy HorsemanBill  Veazey M.D.   On: 09/22/2013 12:50   Dg Abd Acute W/chest  09/21/2013   CLINICAL DATA:  Nausea vomiting and diarrhea for 4 days.  EXAM: ACUTE ABDOMEN SERIES (ABDOMEN 2 VIEW & CHEST 1 VIEW)  COMPARISON:  Chest x-ray 06/2013 and 05/31/2008  FINDINGS: Examination demonstrates the lungs to be adequately inflated  without consolidation or effusion. The cardiomediastinal silhouette and remainder of the thorax is unchanged.  Abdominal pelvic images demonstrate multiple air-filled dilated small bowel loops measuring up to 6 cm in diameter. There are several scattered air-fluid levels. There is no free peritoneal air. There are degenerative changes of the spine and hips.  IMPRESSION: Multiple air-filled moderately dilated small bowel loops with air-fluid levels suggesting distal small bowel obstruction and less likely ileus.  No acute cardiopulmonary disease.   Electronically Signed   By: Elberta Fortisaniel  Boyle M.D.   On: 09/21/2013 21:02   Dg Abd Portable 2v  09/23/2013   CLINICAL DATA:  History of ileus versus small-bowel obstruction  EXAM: PORTABLE ABDOMEN - 2 VIEW  COMPARISON:  09/22/2013  FINDINGS: Nasogastric tube is once again appreciated tip projecting region the proximal stomach. There is no significant change in the moderately dilated loops small bowel. Paucity of gas is appreciated within the colon. There is no evidence of intraperitoneal free air. The osseous structures demonstrate degenerative changes within the lower lumbar spine and mild dextroscoliosis.  IMPRESSION: No significant change in the bowel gas pattern. There is no evidence of intraperitoneal free air. Differential considerations again include small-bowel obstruction versus an ileus. Continued surveillance evaluation recommended.   Electronically Signed   By: Salome HolmesHector  Cooper M.D.   On: 09/23/2013 09:48    Medications: Scheduled Meds: . antiseptic oral rinse  15 mL Mouth Rinse q12n4p  . chlorhexidine  15 mL Mouth Rinse BID  . hydrALAZINE  10 mg Intravenous Q6H  . levofloxacin (LEVAQUIN) IV  750 mg Intravenous Q48H  . metronidazole  500 mg Intravenous Q8H  . pantoprazole  40 mg Oral Daily      LOS: 5 days   RAI,RIPUDEEP M.D. [facility] 09/26/2013, 2:40 PM Pager: [phone]  If 7PM-7AM, please contact night-coverage [email or web address] Password  [email or web address]

## 2013-09-26 NOTE — Progress Notes (Signed)
ANTIBIOTIC CONSULT NOTE - FOLLOW UP  Pharmacy Consult for Levaquin Indication: GI coverage  Allergies  Allergen Reactions  . Flomax [Tamsulosin Hcl]     Patient Measurements: Height: 5' 8.5" (174 cm) Weight: 190 lb 7.6 oz (86.4 kg) IBW/kg (Calculated) : 69.55 Adjusted Body Weight:   Vital Signs: Temp: 98.3 F (36.8 C) (01/05 16100648) Temp src: Oral (01/04 2147) BP: 146/63 mmHg (01/05 0648) Pulse Rate: 77 (01/05 0648) Intake/Output from previous day: 01/04 0701 - 01/05 0700 In: 1400 [I.V.:500; IV Piggyback:900] Out: 1450 [Urine:1450] Intake/Output from this shift:    Labs:  Recent Labs  09/24/13 0400 09/25/13 0645 09/26/13 0528  WBC 7.1  --   --   HGB 14.1  --   --   PLT 332  --   --   CREATININE 1.54* 1.35 1.34   Estimated Creatinine Clearance: 46.7 ml/min (by C-G formula based on Cr of 1.34). No results found for this basename: VANCOTROUGH, VANCOPEAK, VANCORANDOM, GENTTROUGH, GENTPEAK, GENTRANDOM, TOBRATROUGH, TOBRAPEAK, TOBRARND, AMIKACINPEAK, AMIKACINTROU, AMIKACIN,  in the last 72 hours   Microbiology: No results found for this or any previous visit (from the past 720 hour(s)).  Anti-infectives   Start     Dose/Rate Route Frequency Ordered Stop   09/24/13 2200  vancomycin (VANCOCIN) IVPB 1000 mg/200 mL premix  Status:  Discontinued     1,000 mg 200 mL/hr over 60 Minutes Intravenous Every 48 hours 09/22/13 0747 09/22/13 0748   09/24/13 1000  vancomycin (VANCOCIN) IVPB 1000 mg/200 mL premix  Status:  Discontinued     1,000 mg 200 mL/hr over 60 Minutes Intravenous Every 48 hours 09/22/13 0919 09/23/13 0906   09/23/13 1400  piperacillin-tazobactam (ZOSYN) IVPB 3.375 g  Status:  Discontinued     3.375 g 12.5 mL/hr over 240 Minutes Intravenous 3 times per day 09/23/13 0907 09/23/13 0951   09/23/13 1100  levofloxacin (LEVAQUIN) IVPB 750 mg     750 mg 100 mL/hr over 90 Minutes Intravenous Every 48 hours 09/23/13 0958     09/23/13 1000  vancomycin (VANCOCIN) IVPB  1000 mg/200 mL premix  Status:  Discontinued     1,000 mg 200 mL/hr over 60 Minutes Intravenous Every 24 hours 09/23/13 0906 09/23/13 0950   09/23/13 1000  metroNIDAZOLE (FLAGYL) IVPB 500 mg     500 mg 100 mL/hr over 60 Minutes Intravenous Every 8 hours 09/23/13 0951     09/22/13 1200  piperacillin-tazobactam (ZOSYN) IVPB 2.25 g  Status:  Discontinued     2.25 g 100 mL/hr over 30 Minutes Intravenous 4 times per day 09/22/13 0919 09/23/13 0907   09/22/13 1030  vancomycin (VANCOCIN) 1,750 mg in sodium chloride 0.9 % 500 mL IVPB  Status:  Discontinued     1,750 mg 250 mL/hr over 120 Minutes Intravenous  Once 09/22/13 0907 09/22/13 0915   09/22/13 1030  piperacillin-tazobactam (ZOSYN) IVPB 3.375 g  Status:  Discontinued     3.375 g 100 mL/hr over 30 Minutes Intravenous  Once 09/22/13 0907 09/22/13 0915   09/22/13 1030  vancomycin (VANCOCIN) IVPB 750 mg/150 ml premix     750 mg 150 mL/hr over 60 Minutes Intravenous  Once 09/22/13 0919 09/22/13 1134   09/22/13 1000  piperacillin-tazobactam (ZOSYN) IVPB 2.25 g  Status:  Discontinued     2.25 g 100 mL/hr over 30 Minutes Intravenous Every 6 hours 09/22/13 0746 09/22/13 0748   09/22/13 0200  vancomycin (VANCOCIN) IVPB 1000 mg/200 mL premix     1,000 mg 200 mL/hr over 60  Minutes Intravenous  Once 09/22/13 0153 09/22/13 0320   09/22/13 0015  piperacillin-tazobactam (ZOSYN) IVPB 3.375 g     3.375 g 100 mL/hr over 30 Minutes Intravenous  Once 09/22/13 0001 09/22/13 0142      Assessment: 80 yo noted with diarrhea, vomiting with pSBO and ARF. Vancomycin/zosyn for ischemic small bowel d/c'd 1/2. Pharmacy consulted to dose levaquin. Flagyl added.  Infectious Disease - WBC 7.1, afebrile, Scr 1.34 improved . CrCl 46. No cultures. Cdiff negative  1/1 vancomycin>> 1/2 1/1 zosyn>> 1/2 1/2 levaquin >> 1/2 flagyl >>   Plan:  -Levaquin 750 mg IV q 48 hours. No changed in dose/frequency today.   Audray Rumore S. Merilynn Finland, PharmD, BCPS Clinical Staff  Pharmacist Pager [phone]  Misty Stanley Stillinger 09/26/2013,7:19 AM

## 2013-09-26 NOTE — Progress Notes (Signed)
Subjective: No abdominal pain, passed a large amount of gas, C/O heartburn  Objective: Vital signs in last 24 hours: Temp:  [97.3 F (36.3 C)-98.3 F (36.8 C)] 98.3 F (36.8 C) (01/05 0648) Pulse Rate:  [51-91] 77 (01/05 0648) Resp:  [18-20] 18 (01/05 0648) BP: (119-169)/(63-90) 146/63 mmHg (01/05 0648) SpO2:  [97 %-100 %] 97 % (01/05 0648) Weight:  [190 lb 7.6 oz (86.4 kg)] 190 lb 7.6 oz (86.4 kg) 10-21-22 1620) Last BM Date: 09/21/13  Intake/Output from previous day: 10/21/22 0701 - 01/05 0700 In: 1400 [I.V.:500; IV Piggyback:900] Out: 1450 [Urine:1450] Intake/Output this shift:    General appearance: alert and cooperative Resp: clear to auscultation bilaterally GI: soft, NT, active BS  Lab Results:   Recent Labs  09/24/13 0400  WBC 7.1  HGB 14.1  HCT 40.8  PLT 332   BMET  Recent Labs  2013-10-21 0645 09/26/13 0528  NA 148* 146  K 3.6* 4.0  CL 103 104  CO2 28 29  GLUCOSE 103* 162*  BUN 54* 44*  CREATININE 1.35 1.34  CALCIUM 9.1 9.0   PT/INR No results found for this basename: LABPROT, INR,  in the last 72 hours ABG No results found for this basename: PHART, PCO2, PO2, HCO3,  in the last 72 hours  Studies/Results: Dg Abd 2 Views  2013/10/21   CLINICAL DATA:  Small bowel obstruction.  Followup.  Hypertension.  EXAM: ABDOMEN - 2 VIEW  COMPARISON:  DG ABD 2 VIEWS dated 09/24/2013; DG ABD 2 VIEWS dated 09/22/2013; CT ABD/PELV WO CM dated 09/21/2013  FINDINGS: Upright and supine views. The upright view demonstrates multiple small bowel air-fluid levels. No free intraperitoneal air. Nasogastric tube with tip at gastric body. Side port above the gastroesophageal junction.  Right hemidiaphragm elevation.  Supine view demonstrates small bowel dilatation at up to 4.3 cm. Slightly increased from 4.0 cm on the prior. No plain film evidence of pneumatosis and no free intraperitoneal air. Previously described portal venous gas is not readily apparent.  IMPRESSION: Slight  increase in small bowel dilatation and air-fluid levels. Findings suggest partial small bowel obstruction.  Nasogastric tube with side port above the gastroesophageal junction. Consider advancement.   Electronically Signed   By: Jeronimo Greaves M.D.   On: 21-Oct-2013 16:17   Dg Abd 2 Views  09/24/2013   CLINICAL DATA:  Ileus versus partial small bowel obstruction, no history of surgery, no abdominal pain  EXAM: ABDOMEN - 2 VIEW  COMPARISON:  09/23/2013  FINDINGS: NG tube projects over the a stomach. There are several dilated loops of mid abdominal small bowel showing air-fluid levels. There is minimal gas distally. Small bowel is distended to a maximal diameter of about 5.2 cm. When compared to the prior study this is not significantly different. There is some gas seen into the rectum.  IMPRESSION: Very similar appearance to the prior study with findings suggesting either partial small bowel obstruction or ileus, with obstruction considered more likely.   Electronically Signed   By: Esperanza [name] M.D.   On: 09/24/2013 15:54    Anti-infectives: Anti-infectives   Start     Dose/Rate Route Frequency Ordered Stop   09/24/13 2200  vancomycin (VANCOCIN) IVPB 1000 mg/200 mL premix  Status:  Discontinued     1,000 mg 200 mL/hr over 60 Minutes Intravenous Every 48 hours 09/22/13 0747 09/22/13 0748   09/24/13 1000  vancomycin (VANCOCIN) IVPB 1000 mg/200 mL premix  Status:  Discontinued     1,000 mg 200 mL/hr  over 60 Minutes Intravenous Every 48 hours 09/22/13 0919 09/23/13 0906   09/23/13 1400  piperacillin-tazobactam (ZOSYN) IVPB 3.375 g  Status:  Discontinued     3.375 g 12.5 mL/hr over 240 Minutes Intravenous 3 times per day 09/23/13 0907 09/23/13 0951   09/23/13 1100  levofloxacin (LEVAQUIN) IVPB 750 mg     750 mg 100 mL/hr over 90 Minutes Intravenous Every 48 hours 09/23/13 0958     09/23/13 1000  vancomycin (VANCOCIN) IVPB 1000 mg/200 mL premix  Status:  Discontinued     1,000 mg 200 mL/hr over 60  Minutes Intravenous Every 24 hours 09/23/13 0906 09/23/13 0950   09/23/13 1000  metroNIDAZOLE (FLAGYL) IVPB 500 mg     500 mg 100 mL/hr over 60 Minutes Intravenous Every 8 hours 09/23/13 0951     09/22/13 1200  piperacillin-tazobactam (ZOSYN) IVPB 2.25 g  Status:  Discontinued     2.25 g 100 mL/hr over 30 Minutes Intravenous 4 times per day 09/22/13 0919 09/23/13 0907   09/22/13 1030  vancomycin (VANCOCIN) 1,750 mg in sodium chloride 0.9 % 500 mL IVPB  Status:  Discontinued     1,750 mg 250 mL/hr over 120 Minutes Intravenous  Once 09/22/13 0907 09/22/13 0915   09/22/13 1030  piperacillin-tazobactam (ZOSYN) IVPB 3.375 g  Status:  Discontinued     3.375 g 100 mL/hr over 30 Minutes Intravenous  Once 09/22/13 0907 09/22/13 0915   09/22/13 1030  vancomycin (VANCOCIN) IVPB 750 mg/150 ml premix     750 mg 150 mL/hr over 60 Minutes Intravenous  Once 09/22/13 0919 09/22/13 1134   09/22/13 1000  piperacillin-tazobactam (ZOSYN) IVPB 2.25 g  Status:  Discontinued     2.25 g 100 mL/hr over 30 Minutes Intravenous Every 6 hours 09/22/13 0746 09/22/13 0748   09/22/13 0200  vancomycin (VANCOCIN) IVPB 1000 mg/200 mL premix     1,000 mg 200 mL/hr over 60 Minutes Intravenous  Once 09/22/13 0153 09/22/13 0320   09/22/13 0015  piperacillin-tazobactam (ZOSYN) IVPB 3.375 g     3.375 g 100 mL/hr over 30 Minutes Intravenous  Once 09/22/13 0001 09/22/13 0142      Assessment/Plan: PSBO - seems to be improving, no N/V with NGT clamped, passed a lot of gas - D/C NGT and try clears HB - start Protonix and Maalox  LOS: 5 days    Jonan Seufert E 09/26/2013

## 2013-09-27 NOTE — Progress Notes (Signed)
Patient ID: Miguel Hardy, male   DOB: 1930/08/30, 80 y.o.   MRN: [id number]   Subjective: Pt had a BM yesterday and this AM, tolerated liquids.  Not ambulating much.    Objective:  Vital signs:  Filed Vitals:   09/26/13 1445 09/26/13 2110 09/26/13 2230 09/27/13 0620  BP: 127/62 115/42 119/54 124/59  Pulse: 94 81 91 93  Temp: 98.2 F (36.8 C) 99.1 F (37.3 C)  98.1 F (36.7 C)  TempSrc: Oral Oral  Oral  Resp: '18 18  18  ' Height:      Weight:      SpO2: 96% 97%  96%    Last BM Date: 09/21/13  Intake/Output   Yesterday:  01/05 0701 - 01/06 0700 In: 3300 [P.O.:600; I.V.:2400; IV Piggyback:300] Out: 200 [Urine:200]   Physical Exam: General: Pt awake/alert/oriented x3 in no acute distress Chest: CTA. No chest wall pain w good excursion CV:  S1S2 RRR no murmurs gallops or rubs.  No edema.  Abdomen: Soft.  Nondistended. Non tender. No evidence of peritonitis.  No incarcerated hernias.  Problem List:   Principal Problem:   SBO (small bowel obstruction) Active Problems:   ARF (acute renal failure)   HTN (hypertension), benign   Hypercholesterolemia   Dehydration   Small bowel ischemia    Results:   Labs: Results for orders placed during the hospital encounter of 09/21/13 (from the past 57 hour(s))  BASIC METABOLIC PANEL     Status: Abnormal   Collection Time    09/26/13  5:28 AM      Result Value Range   Sodium 146  137 - 147 mEq/L   Comment: Please note change in reference range.   Potassium 4.0  3.7 - 5.3 mEq/L   Comment: Please note change in reference range.   Chloride 104  96 - 112 mEq/L   CO2 29  19 - 32 mEq/L   Glucose, Bld 162 (*) 70 - 99 mg/dL   BUN 44 (*) 6 - 23 mg/dL   Creatinine, Ser 1.34  0.50 - 1.35 mg/dL   Calcium 9.0  8.4 - 10.5 mg/dL   GFR calc non Af Amer 48 (*) >90 mL/min   GFR calc Af Amer 56 (*) >90 mL/min   Comment: (NOTE)     The eGFR has been calculated using the CKD EPI equation.     This calculation has not been validated in  all clinical situations.     eGFR's persistently <90 mL/min signify possible Chronic Kidney     Disease.    Imaging / Studies: Dg Abd 2 Views  09/25/2013   CLINICAL DATA:  Small bowel obstruction.  Followup.  Hypertension.  EXAM: ABDOMEN - 2 VIEW  COMPARISON:  DG ABD 2 VIEWS dated 09/24/2013; DG ABD 2 VIEWS dated 09/22/2013; CT ABD/PELV WO CM dated 09/21/2013  FINDINGS: Upright and supine views. The upright view demonstrates multiple small bowel air-fluid levels. No free intraperitoneal air. Nasogastric tube with tip at gastric body. Side port above the gastroesophageal junction.  Right hemidiaphragm elevation.  Supine view demonstrates small bowel dilatation at up to 4.3 cm. Slightly increased from 4.0 cm on the prior. No plain film evidence of pneumatosis and no free intraperitoneal air. Previously described portal venous gas is not readily apparent.  IMPRESSION: Slight increase in small bowel dilatation and air-fluid levels. Findings suggest partial small bowel obstruction.  Nasogastric tube with side port above the gastroesophageal junction. Consider advancement.   Electronically Signed   By: Marylyn Ishihara  Jobe Igo M.D.   On: 09/25/2013 16:17    Medications / Allergies: per chart  Antibiotics: Anti-infectives   Start     Dose/Rate Route Frequency Ordered Stop   09/24/13 2200  vancomycin (VANCOCIN) IVPB 1000 mg/200 mL premix  Status:  Discontinued     1,000 mg 200 mL/hr over 60 Minutes Intravenous Every 48 hours 09/22/13 0747 09/22/13 0748   09/24/13 1000  vancomycin (VANCOCIN) IVPB 1000 mg/200 mL premix  Status:  Discontinued     1,000 mg 200 mL/hr over 60 Minutes Intravenous Every 48 hours 09/22/13 0919 09/23/13 0906   09/23/13 1400  piperacillin-tazobactam (ZOSYN) IVPB 3.375 g  Status:  Discontinued     3.375 g 12.5 mL/hr over 240 Minutes Intravenous 3 times per day 09/23/13 0907 09/23/13 0951   09/23/13 1100  levofloxacin (LEVAQUIN) IVPB 750 mg     750 mg 100 mL/hr over 90 Minutes Intravenous Every  48 hours 09/23/13 0958     09/23/13 1000  vancomycin (VANCOCIN) IVPB 1000 mg/200 mL premix  Status:  Discontinued     1,000 mg 200 mL/hr over 60 Minutes Intravenous Every 24 hours 09/23/13 0906 09/23/13 0950   09/23/13 1000  metroNIDAZOLE (FLAGYL) IVPB 500 mg     500 mg 100 mL/hr over 60 Minutes Intravenous Every 8 hours 09/23/13 0951     09/22/13 1200  piperacillin-tazobactam (ZOSYN) IVPB 2.25 g  Status:  Discontinued     2.25 g 100 mL/hr over 30 Minutes Intravenous 4 times per day 09/22/13 0919 09/23/13 0907   09/22/13 1030  vancomycin (VANCOCIN) 1,750 mg in sodium chloride 0.9 % 500 mL IVPB  Status:  Discontinued     1,750 mg 250 mL/hr over 120 Minutes Intravenous  Once 09/22/13 0907 09/22/13 0915   09/22/13 1030  piperacillin-tazobactam (ZOSYN) IVPB 3.375 g  Status:  Discontinued     3.375 g 100 mL/hr over 30 Minutes Intravenous  Once 09/22/13 0907 09/22/13 0915   09/22/13 1030  vancomycin (VANCOCIN) IVPB 750 mg/150 ml premix     750 mg 150 mL/hr over 60 Minutes Intravenous  Once 09/22/13 0919 09/22/13 1134   09/22/13 1000  piperacillin-tazobactam (ZOSYN) IVPB 2.25 g  Status:  Discontinued     2.25 g 100 mL/hr over 30 Minutes Intravenous Every 6 hours 09/22/13 0746 09/22/13 0748   09/22/13 0200  vancomycin (VANCOCIN) IVPB 1000 mg/200 mL premix     1,000 mg 200 mL/hr over 60 Minutes Intravenous  Once 09/22/13 0153 09/22/13 0320   09/22/13 0015  piperacillin-tazobactam (ZOSYN) IVPB 3.375 g     3.375 g 100 mL/hr over 30 Minutes Intravenous  Once 09/22/13 0001 09/22/13 0142      Assessment/Plan:  PSBO  Resolving, advance to full liquid diet today Mobilize  Erby Pian, [facility] Pager [phone] Office [phone]  09/27/2013 7:40 AM

## 2013-09-27 NOTE — Progress Notes (Signed)
Patient ID: Miguel Hardy  male  ZOX:[id number]    DOB: [birth year]    DOA: 09/21/2013  PCP: Rudi Heap, MD  Assessment/Plan:  Principal Problem: PSBO/ ileus : Improving, no abdominal pain, states had a BM yesterday - NGT OFF, tolerating clears at the time of my examination - Surgery following, advance diet to full liquids - OOB, start ambulation  Active Problems:  Diarrhea/ Vomiting/ pneumatosis- likely small bowel ischemic event  - C. Diff negative, continue Levaquin and Flagyl   ARF (acute renal failure)  - likely prerenal and resolving rapidly with IV hydration - hold HCTZ and Lisinopril   Hypernatremia,and hypokalemia:  improved    HTN (hypertension), benign stable  Hypercholesterolemia  - holding Lipitor   DVT Prophylaxis:  Code Status:  Family Communication:  Disposition:  hopefully tomorrow if tolerating solid diet    Subjective:  tolerating clear liquid diet, NGT out, feels relieved   Objective: Weight change:   Intake/Output Summary (Last 24 hours) at 09/27/13 1457 Last data filed at 09/27/13 1400  Gross per 24 hour  Intake   4050 ml  Output    400 ml  Net   3650 ml   Blood pressure 123/59, pulse 87, temperature 97.3 F (36.3 C), temperature source Oral, resp. rate 20, height 5' 8.5" (1.74 m), weight 86.4 kg (190 lb 7.6 oz), SpO2 96.00%.  Physical Exam: General: A x O x 3 CVS: S1-S2 clear, no mrg Chest: CTAB Abdomen: soft, bowel sounds+ Extremities: no cyanosis, clubbing or edema noted bilaterally   Lab Results: Basic Metabolic Panel:  Recent Labs Lab 09/25/13 0645 09/26/13 0528  NA 148* 146  K 3.6* 4.0  CL 103 104  CO2 28 29  GLUCOSE 103* 162*  BUN 54* 44*  CREATININE 1.35 1.34  CALCIUM 9.1 9.0  MG 2.3  --    Liver Function Tests:  Recent Labs Lab 09/21/13 1925 09/23/13 0500  AST 24 17  ALT 28 21  ALKPHOS 60 53  BILITOT 0.7 0.7  PROT 8.1 6.9  ALBUMIN 3.9 3.1*    Recent Labs Lab 09/21/13 1925  LIPASE 61*   No  results found for this basename: AMMONIA,  in the last 168 hours CBC:  Recent Labs Lab 09/21/13 1925 09/23/13 0500 09/24/13 0400  WBC 6.0 6.6 7.1  NEUTROABS 4.1  --   --   HGB 15.3 14.8 14.1  HCT 42.9 41.2 40.8  MCV 85.3 87.3 87.2  PLT 368 324 332   Cardiac Enzymes: No results found for this basename: CKTOTAL, CKMB, CKMBINDEX, TROPONINI,  in the last 168 hours BNP: No components found with this basename: POCBNP,  CBG:  Recent Labs Lab 09/24/13 0804  GLUCAP 80     Micro Results: No results found for this or any previous visit (from the past 240 hour(s)).  Studies/Results: Ct Abdomen Pelvis Wo Contrast  09/21/2013   CLINICAL DATA:  Vomiting and diarrhea for 4 days. Appendectomy. Hypertension.  EXAM: CT ABDOMEN AND PELVIS WITHOUT CONTRAST  TECHNIQUE: Multidetector CT imaging of the abdomen and pelvis was performed following the standard protocol without intravenous contrast.  COMPARISON:  Plain films of earlier in the day.  No prior CT.  FINDINGS: Lower Chest: Clear lung bases. Normal heart size without pericardial or pleural effusion. Multivessel coronary artery atherosclerosis.  Abdomen/Pelvis: Mild hepatic steatosis. Small volume portal venous gas, including within the left lobe of the liver on image 20.  Splenule. Mild gastric distension. Normal pancreas, gallbladder, biliary tract, adrenal glands. Interpolar right  renal lesion which measures fluid density and 1.7 cm, most consistent with a cyst. Smaller bilateral renal lesions which also measure fluid density and are likely cysts. Renal cortical thinning bilaterally.  Aortic and branch vessel atherosclerosis. No retroperitoneal or retrocrural adenopathy. Extensive colonic diverticulosis, including within the sigmoid. Normal terminal ileum.  Small bowel loops are dilated and fluid-filled. Undergo a relatively gradual transition to normal to decompressed distal small bowel. No abrupt transition identified.  No extraluminal gas  identified. Lucencies within mid small bowel loops are suspicious for pneumatosis. Example on image 75/series 2.  Bilateral fat containing inguinal hernias. No pelvic adenopathy. Normal urinary bladder and prostate. No significant free fluid.  Bones/Musculoskeletal: Remote anterior right rib fractures. Degenerative disc disease involves lower lumbar spine.  IMPRESSION: 1. Portal venous gas with proximal and mid small dilatation and suspicion of small volume pneumatosis. Findings are overall suspicious for small bowel ischemia with secondary adynamic ileus. Correlate with lactate levels and clinical exam. These results were called by telephone at the time of interpretation on 09/21/2013 at 11:53 PM to Dr. Bebe Shaggy , who verbally acknowledged these results. 2. Coronary artery atherosclerosis.   Electronically Signed   By: Jeronimo Greaves M.D.   On: 09/21/2013 23:55   Dg Abd 2 Views  09/25/2013   CLINICAL DATA:  Small bowel obstruction.  Followup.  Hypertension.  EXAM: ABDOMEN - 2 VIEW  COMPARISON:  DG ABD 2 VIEWS dated 09/24/2013; DG ABD 2 VIEWS dated 09/22/2013; CT ABD/PELV WO CM dated 09/21/2013  FINDINGS: Upright and supine views. The upright view demonstrates multiple small bowel air-fluid levels. No free intraperitoneal air. Nasogastric tube with tip at gastric body. Side port above the gastroesophageal junction.  Right hemidiaphragm elevation.  Supine view demonstrates small bowel dilatation at up to 4.3 cm. Slightly increased from 4.0 cm on the prior. No plain film evidence of pneumatosis and no free intraperitoneal air. Previously described portal venous gas is not readily apparent.  IMPRESSION: Slight increase in small bowel dilatation and air-fluid levels. Findings suggest partial small bowel obstruction.  Nasogastric tube with side port above the gastroesophageal junction. Consider advancement.   Electronically Signed   By: Jeronimo Greaves M.D.   On: 09/25/2013 16:17   Dg Abd 2 Views  09/24/2013   CLINICAL DATA:   Ileus versus partial small bowel obstruction, no history of surgery, no abdominal pain  EXAM: ABDOMEN - 2 VIEW  COMPARISON:  09/23/2013  FINDINGS: NG tube projects over the a stomach. There are several dilated loops of mid abdominal small bowel showing air-fluid levels. There is minimal gas distally. Small bowel is distended to a maximal diameter of about 5.2 cm. When compared to the prior study this is not significantly different. There is some gas seen into the rectum.  IMPRESSION: Very similar appearance to the prior study with findings suggesting either partial small bowel obstruction or ileus, with obstruction considered more likely.   Electronically Signed   By: Esperanza [name] M.D.   On: 09/24/2013 15:54   Dg Abd 2 Views  09/22/2013   CLINICAL DATA:  Vomiting with diarrhea for 5 days.  EXAM: ABDOMEN - 2 VIEW  COMPARISON:  Radiographs and CT 09/21/2013.  FINDINGS: Nasogastric tube has been placed into the proximal stomach. There is persistent moderate proximal to mid small bowel distension. The colon remains relatively decompressed. There is mild small bowel wall thickening. No free intraperitoneal air or portal venous gas is apparent radiographically. Mild lumbar spondylosis is noted.  IMPRESSION: No significant change  in proximal small bowel distension following nasogastric tube placement. Pattern remains obstructive, although could be secondary to ischemic ileus as suggested on CT.   Electronically Signed   By: Roxy HorsemanBill  Veazey M.D.   On: 09/22/2013 12:50   Dg Abd Acute W/chest  09/21/2013   CLINICAL DATA:  Nausea vomiting and diarrhea for 4 days.  EXAM: ACUTE ABDOMEN SERIES (ABDOMEN 2 VIEW & CHEST 1 VIEW)  COMPARISON:  Chest x-ray 06/2013 and 05/31/2008  FINDINGS: Examination demonstrates the lungs to be adequately inflated without consolidation or effusion. The cardiomediastinal silhouette and remainder of the thorax is unchanged.  Abdominal pelvic images demonstrate multiple air-filled dilated small  bowel loops measuring up to 6 cm in diameter. There are several scattered air-fluid levels. There is no free peritoneal air. There are degenerative changes of the spine and hips.  IMPRESSION: Multiple air-filled moderately dilated small bowel loops with air-fluid levels suggesting distal small bowel obstruction and less likely ileus.  No acute cardiopulmonary disease.   Electronically Signed   By: Elberta Fortisaniel  Boyle M.D.   On: 09/21/2013 21:02   Dg Abd Portable 2v  09/23/2013   CLINICAL DATA:  History of ileus versus small-bowel obstruction  EXAM: PORTABLE ABDOMEN - 2 VIEW  COMPARISON:  09/22/2013  FINDINGS: Nasogastric tube is once again appreciated tip projecting region the proximal stomach. There is no significant change in the moderately dilated loops small bowel. Paucity of gas is appreciated within the colon. There is no evidence of intraperitoneal free air. The osseous structures demonstrate degenerative changes within the lower lumbar spine and mild dextroscoliosis.  IMPRESSION: No significant change in the bowel gas pattern. There is no evidence of intraperitoneal free air. Differential considerations again include small-bowel obstruction versus an ileus. Continued surveillance evaluation recommended.   Electronically Signed   By: Salome HolmesHector  Cooper M.D.   On: 09/23/2013 09:48    Medications: Scheduled Meds: . hydrALAZINE  10 mg Intravenous Q6H  . levofloxacin (LEVAQUIN) IV  750 mg Intravenous Q48H  . metronidazole  500 mg Intravenous Q8H  . pantoprazole  40 mg Oral Daily      LOS: 6 days   RAI,RIPUDEEP M.D. [facility] 09/27/2013, 2:57 PM Pager: [phone]  If 7PM-7AM, please contact night-coverage [email or web address] Password [email or web address]

## 2013-09-27 NOTE — Progress Notes (Signed)
I have seen and examined the pt and agree with PA-Riebock's progress note. Adv diet to soft as tol Ambulate

## 2013-09-28 MED ORDER — POLYETHYLENE GLYCOL 3350 17 G PO PACK: 17.0000 g | PACK | Freq: Every day | ORAL | Status: DC | PRN

## 2013-09-28 MED ORDER — PANTOPRAZOLE SODIUM 40 MG PO TBEC: 40.0000 mg | DELAYED_RELEASE_TABLET | Freq: Every day | ORAL | Status: DC

## 2013-09-28 MED ORDER — CIPROFLOXACIN HCL 500 MG PO TABS: 500.0000 mg | ORAL_TABLET | Freq: Two times a day (BID) | ORAL | Status: DC

## 2013-09-28 MED ORDER — METRONIDAZOLE 500 MG PO TABS: 500.0000 mg | ORAL_TABLET | Freq: Three times a day (TID) | ORAL | Status: DC

## 2013-09-28 MED ORDER — PROMETHAZINE HCL 12.5 MG PO TABS: 12.5000 mg | ORAL_TABLET | Freq: Four times a day (QID) | ORAL | Status: DC | PRN

## 2013-09-28 MED ORDER — PSYLLIUM 0.52 G PO CAPS: 0.5200 g | ORAL_CAPSULE | Freq: Every day | ORAL | Status: DC

## 2013-09-28 NOTE — Progress Notes (Signed)
Subjective: Tolerated low residue diet, had a small soft bm yesterday, passing flatus, ambulating.    Objective: Vital signs in last 24 hours: Temp:  [97.3 F (36.3 C)-98 F (36.7 C)] 97.5 F (36.4 C) (01/07 0534) Pulse Rate:  [82-87] 84 (01/07 0534) Resp:  [19-20] 20 (01/07 0534) BP: (107-130)/(52-64) 109/57 mmHg (01/07 0534) SpO2:  [96 %-98 %] 97 % (01/07 0534) Last BM Date: 09/27/13  Intake/Output from previous day: 01/06 0701 - 01/07 0700 In: 4290 [P.O.:1440; I.V.:2400; IV Piggyback:450] Out: 1225 [Urine:1225] Intake/Output this shift:   Physical Exam:  General: Pt awake/alert/oriented x3 in no acute distress  Chest: CTA. No chest wall pain w good excursion  CV: S1S2 RRR no murmurs gallops or rubs. No edema.  Abdomen: Soft. Nondistended. Non tender. No evidence of peritonitis. No incarcerated hernias.   Lab Results:  No results found for this basename: WBC, HGB, HCT, PLT,  in the last 72 hours BMET  Recent Labs  09/26/13 0528  NA 146  K 4.0  CL 104  CO2 29  GLUCOSE 162*  BUN 44*  CREATININE 1.34  CALCIUM 9.0   PT/INR No results found for this basename: LABPROT, INR,  in the last 72 hours ABG No results found for this basename: PHART, PCO2, PO2, HCO3,  in the last 72 hours  Studies/Results: No results found.  Anti-infectives: Anti-infectives   Start     Dose/Rate Route Frequency Ordered Stop   09/24/13 2200  vancomycin (VANCOCIN) IVPB 1000 mg/200 mL premix  Status:  Discontinued     1,000 mg 200 mL/hr over 60 Minutes Intravenous Every 48 hours 09/22/13 0747 09/22/13 0748   09/24/13 1000  vancomycin (VANCOCIN) IVPB 1000 mg/200 mL premix  Status:  Discontinued     1,000 mg 200 mL/hr over 60 Minutes Intravenous Every 48 hours 09/22/13 0919 09/23/13 0906   09/23/13 1400  piperacillin-tazobactam (ZOSYN) IVPB 3.375 g  Status:  Discontinued     3.375 g 12.5 mL/hr over 240 Minutes Intravenous 3 times per day 09/23/13 0907 09/23/13 0951   09/23/13 1100   levofloxacin (LEVAQUIN) IVPB 750 mg     750 mg 100 mL/hr over 90 Minutes Intravenous Every 48 hours 09/23/13 0958     09/23/13 1000  vancomycin (VANCOCIN) IVPB 1000 mg/200 mL premix  Status:  Discontinued     1,000 mg 200 mL/hr over 60 Minutes Intravenous Every 24 hours 09/23/13 0906 09/23/13 0950   09/23/13 1000  metroNIDAZOLE (FLAGYL) IVPB 500 mg     500 mg 100 mL/hr over 60 Minutes Intravenous Every 8 hours 09/23/13 0951     09/22/13 1200  piperacillin-tazobactam (ZOSYN) IVPB 2.25 g  Status:  Discontinued     2.25 g 100 mL/hr over 30 Minutes Intravenous 4 times per day 09/22/13 0919 09/23/13 0907   09/22/13 1030  vancomycin (VANCOCIN) 1,750 mg in sodium chloride 0.9 % 500 mL IVPB  Status:  Discontinued     1,750 mg 250 mL/hr over 120 Minutes Intravenous  Once 09/22/13 0907 09/22/13 0915   09/22/13 1030  piperacillin-tazobactam (ZOSYN) IVPB 3.375 g  Status:  Discontinued     3.375 g 100 mL/hr over 30 Minutes Intravenous  Once 09/22/13 0907 09/22/13 0915   09/22/13 1030  vancomycin (VANCOCIN) IVPB 750 mg/150 ml premix     750 mg 150 mL/hr over 60 Minutes Intravenous  Once 09/22/13 0919 09/22/13 1134   09/22/13 1000  piperacillin-tazobactam (ZOSYN) IVPB 2.25 g  Status:  Discontinued     2.25 g 100  mL/hr over 30 Minutes Intravenous Every 6 hours 09/22/13 0746 09/22/13 0748   09/22/13 0200  vancomycin (VANCOCIN) IVPB 1000 mg/200 mL premix     1,000 mg 200 mL/hr over 60 Minutes Intravenous  Once 09/22/13 0153 09/22/13 0320   09/22/13 0015  piperacillin-tazobactam (ZOSYN) IVPB 3.375 g     3.375 g 100 mL/hr over 30 Minutes Intravenous  Once 09/22/13 0001 09/22/13 0142      Assessment/Plan: PSBO: resolved, advance to regular diet.  Discussed adequate oral hydration, "good bowel habits" may take miralax or metamucil to achieve 1 soft bm per day.  Stable for discharge from surgical standpoint.  He does not need to follow up with [facility].    LOS: 7 days    Javanni Maring ANP-BC 09/28/2013  8:11 AM

## 2013-09-28 NOTE — Evaluation (Signed)
Physical Therapy Evaluation Patient Details Name: Miguel HeirBobby L Shedrick MRN: [id number] DOB: Feb 13, 1930 Today's Date: 09/28/2013 Time: [id number] PT Time Calculation (min): 14 min  PT Assessment / Plan / Recommendation History of Present Illness  Miguel Hardy is a 80 y.o. male with a past medical history of hypertension, and hypercholesterolemia, who was in his usual state of health of this past Saturday when he started having episodes of diarrhea, along with vomiting. He also had abdominal pain, which was 6/10 in intensity, which was aching pain in the lower abdomen. There was no radiation of the pain. No precipitating, aggravating or relieving factors were identified. Pt with small bowel obstruction  Clinical Impression  Pt prefers using RW at this time due to weakness, pt is mod I with gait and mobility with RW, no further PT needs identified at this time.    PT Assessment  Patent does not need any further PT services    Follow Up Recommendations  No PT follow up    Does the patient have the potential to tolerate intense rehabilitation      Barriers to Discharge        Equipment Recommendations  Rolling walker with 5" wheels    Recommendations for Other Services     Frequency      Precautions / Restrictions Restrictions Weight Bearing Restrictions: No   Pertinent Vitals/Pain No c/o pain      Mobility  Ambulation/Gait Ambulation Distance (Feet): 250 Feet General Gait Details: pt states he prefers gait with RW while he feels weak    Exercises     PT Diagnosis:    PT Problem List:   PT Treatment Interventions:       PT Goals(Current goals can be found in the care plan section) Acute Rehab PT Goals PT Goal Formulation: No goals set, d/c therapy  Visit Information  Last PT Received On: 09/28/13 Assistance Needed: +1 History of Present Illness: Miguel Hardy is a 80 y.o. male with a past medical history of hypertension, and hypercholesterolemia, who was in his usual state  of health of this past Saturday when he started having episodes of diarrhea, along with vomiting. He also had abdominal pain, which was 6/10 in intensity, which was aching pain in the lower abdomen. There was no radiation of the pain. No precipitating, aggravating or relieving factors were identified. Pt with small bowel obstruction       Prior Functioning  Home Living Family/patient expects to be discharged to:: Private residence Living Arrangements: Children Available Help at Discharge: Family;Available 24 hours/day Type of Home: House Home Access: Ramped entrance Home Layout: One level Home Equipment: None Prior Function Level of Independence: Independent Communication Communication: No difficulties    Cognition  Cognition Arousal/Alertness: Awake/alert Behavior During Therapy: WFL for tasks assessed/performed Overall Cognitive Status: Within Functional Limits for tasks assessed    Extremity/Trunk Assessment Upper Extremity Assessment Upper Extremity Assessment: Overall WFL for tasks assessed Lower Extremity Assessment Lower Extremity Assessment: Overall WFL for tasks assessed Cervical / Trunk Assessment Cervical / Trunk Assessment: Normal   Balance    End of Session PT - End of Session Activity Tolerance: Patient tolerated treatment well Patient left: in chair;with call bell/phone within reach Nurse Communication: Mobility status  GP     Shephanie Romas 09/28/2013, 10:32 AM

## 2013-09-28 NOTE — Progress Notes (Signed)
I have seen and examined the pt and agree with NP-Reibock's progress note.

## 2013-09-28 NOTE — Progress Notes (Signed)
Discharge instructions gone over with patient and family present. Follow up appointment to be made. Prescriptions given. Diet and when to call the doctor gone over. Bowel regimen discussed. Patient verbalized understanding of instructions.

## 2013-09-28 NOTE — Discharge Summary (Signed)
Physician Discharge Summary  Patient ID: Miguel Hardy MRN: [id number] DOB/AGE: [birth year] 80 y.o.  Admit date: 09/21/2013 Discharge date: 09/28/2013  Primary Care Physician:  Rudi Heap, MD  Discharge Diagnoses:    . SBO (small bowel obstruction)- resolved  . ARF (acute renal failure) . HTN (hypertension), benign . Hypercholesterolemia . Dehydration . Small bowel ischemia  Consults: General surgery, Dr. Derrell Lolling   Recommendations for Outpatient Follow-up:  Patient was recommended to hold his BP meds until followup with his PCP, please restart to titrate medications according to his BP   Allergies:   Allergies  Allergen Reactions  . Flomax [Tamsulosin Hcl]      Discharge Medications:   Medication List    STOP taking these medications       hydrochlorothiazide 25 MG tablet  Commonly known as:  HYDRODIURIL      TAKE these medications       atorvastatin 40 MG tablet  Commonly known as:  LIPITOR  Take 40 mg by mouth daily.     ciprofloxacin 500 MG tablet  Commonly known as:  CIPRO  Take 1 tablet (500 mg total) by mouth 2 (two) times daily. X 1 week     dicyclomine 10 MG capsule  Commonly known as:  BENTYL  Take 10 mg by mouth 4 (four) times daily -  before meals and at bedtime.     diphenoxylate-atropine 2.5-0.025 MG per tablet  Commonly known as:  LOMOTIL  Take 1 tablet by mouth as needed for diarrhea or loose stools.     lisinopril 40 MG tablet  Commonly known as:  PRINIVIL,ZESTRIL  Take 40 mg by mouth daily.     metroNIDAZOLE 500 MG tablet  Commonly known as:  FLAGYL  Take 1 tablet (500 mg total) by mouth 3 (three) times daily. X 1 week     pantoprazole 40 MG tablet  Commonly known as:  PROTONIX  Take 1 tablet (40 mg total) by mouth daily.     polyethylene glycol packet  Commonly known as:  MIRALAX  Take 17 g by mouth daily as needed for mild constipation or moderate constipation.     promethazine 12.5 MG tablet  Commonly known as:   PHENERGAN  Take 1 tablet (12.5 mg total) by mouth every 6 (six) hours as needed for nausea or vomiting.     psyllium 0.52 G capsule  Commonly known as:  METAMUCIL  Take 1 capsule (0.52 g total) by mouth daily. (Also available OTC)         Brief H and P: For complete details please refer to admission H and P, but in brief Miguel Hardy is a 80 y.o. male with a past medical history of hypertension, and hypercholesterolemia, who was in his usual state of health of this past Saturday when he started having episodes of diarrhea, along with vomiting. He also had abdominal pain, which was 6/10 in intensity, which was aching pain in the lower abdomen. There was no radiation of the pain. No precipitating, aggravating or relieving factors were identified. Denied any blood in the stool or in the emesis. The diarrhea subsided after one day, but he continued to vomit 3-4 times a day. He had fever and chills, but did not check his temperature. He also had abdominal bloating. He's never had this kind of problem in the past. Denies any problems passing urine. He has been constipated in the past   Hospital Course:  80 y/o male with h/o HTN, Hypercholesterolemia who  has been having diarrhea and vomiting for almost [redacted] wk along with diffuse abdominal pain and chills. He was found to have severe acute renal failure and PSBO and transferred from [facility] to [facility].  PSBO/ ileus : Resolved at discharge. Upon evaluation in the ER, patient had NG tube placement and suction and gastric contents, he had denied any bloody bowel movements. General surgery was consulted and recommended conservative management with continued NG tube to low intermittent wall suction with serial abdominal exams. Patient was placed on an n.p.o. status IV fluids. Patient was closely followed by surgery service and at the time of discharge, NG tube has been off, patient is tolerating solid diet, had bowel movement. Patient was  recommended to avoid constipation and use Metamucil, MiraLax as needed.  Diarrhea/ Vomiting/ pneumatosis- likely small bowel ischemic event. C. Diff negative patient was placed on IV Levaquin and Flagyl which was continued for a week after DC   ARF (acute renal failure) - likely prerenal and resolved rapidly with IV hydration. Lisinopril and HCTZ has been held throughout hospitalization. Patient was recommended to continue to hold HCTZ. Start lisinopril only if BP is elevated or continue to hold it until his PCP follow-up   Hypernatremia,and hypokalemia: improved   HTN (hypertension), benign stable   Day of Discharge BP 112/60  Pulse 84  Temp(Src) 97.5 F (36.4 C) (Oral)  Resp 20  Ht 5' 8.5" (1.74 m)  Wt 86.4 kg (190 lb 7.6 oz)  BMI 28.54 kg/m2  SpO2 97%  Physical Exam: General: Alert and awake oriented x3 not in any acute distress. CVS: S1-S2 clear no murmur rubs or gallops Chest: clear to auscultation bilaterally, no wheezing rales or rhonchi Abdomen: soft nontender, nondistended, normal bowel sounds Extremities: no cyanosis, clubbing or edema noted bilaterally Neuro: Cranial nerves II-XII intact, no focal neurological deficits   The results of significant diagnostics from this hospitalization (including imaging, microbiology, ancillary and laboratory) are listed below for reference.    LAB RESULTS: Basic Metabolic Panel:  Recent Labs Lab 09/25/13 0645 09/26/13 0528  NA 148* 146  K 3.6* 4.0  CL 103 104  CO2 28 29  GLUCOSE 103* 162*  BUN 54* 44*  CREATININE 1.35 1.34  CALCIUM 9.1 9.0  MG 2.3  --    Liver Function Tests:  Recent Labs Lab 09/21/13 1925 09/23/13 0500  AST 24 17  ALT 28 21  ALKPHOS 60 53  BILITOT 0.7 0.7  PROT 8.1 6.9  ALBUMIN 3.9 3.1*    Recent Labs Lab 09/21/13 1925  LIPASE 61*   No results found for this basename: AMMONIA,  in the last 168 hours CBC:  Recent Labs Lab 09/21/13 1925 09/23/13 0500 09/24/13 0400  WBC 6.0 6.6 7.1   NEUTROABS 4.1  --   --   HGB 15.3 14.8 14.1  HCT 42.9 41.2 40.8  MCV 85.3 87.3 87.2  PLT 368 324 332   Cardiac Enzymes: No results found for this basename: CKTOTAL, CKMB, CKMBINDEX, TROPONINI,  in the last 168 hours BNP: No components found with this basename: POCBNP,  CBG:  Recent Labs Lab 09/24/13 0804  GLUCAP 80    Significant Diagnostic Studies:  Ct Abdomen Pelvis Wo Contrast  09/21/2013   CLINICAL DATA:  Vomiting and diarrhea for 4 days. Appendectomy. Hypertension.  EXAM: CT ABDOMEN AND PELVIS WITHOUT CONTRAST  TECHNIQUE: Multidetector CT imaging of the abdomen and pelvis was performed following the standard protocol without intravenous contrast.  COMPARISON:  Plain films  of earlier in the day.  No prior CT.  FINDINGS: Lower Chest: Clear lung bases. Normal heart size without pericardial or pleural effusion. Multivessel coronary artery atherosclerosis.  Abdomen/Pelvis: Mild hepatic steatosis. Small volume portal venous gas, including within the left lobe of the liver on image 20.  Splenule. Mild gastric distension. Normal pancreas, gallbladder, biliary tract, adrenal glands. Interpolar right renal lesion which measures fluid density and 1.7 cm, most consistent with a cyst. Smaller bilateral renal lesions which also measure fluid density and are likely cysts. Renal cortical thinning bilaterally.  Aortic and branch vessel atherosclerosis. No retroperitoneal or retrocrural adenopathy. Extensive colonic diverticulosis, including within the sigmoid. Normal terminal ileum.  Small bowel loops are dilated and fluid-filled. Undergo a relatively gradual transition to normal to decompressed distal small bowel. No abrupt transition identified.  No extraluminal gas identified. Lucencies within mid small bowel loops are suspicious for pneumatosis. Example on image 75/series 2.  Bilateral fat containing inguinal hernias. No pelvic adenopathy. Normal urinary bladder and prostate. No significant free  fluid.  Bones/Musculoskeletal: Remote anterior right rib fractures. Degenerative disc disease involves lower lumbar spine.  IMPRESSION: 1. Portal venous gas with proximal and mid small dilatation and suspicion of small volume pneumatosis. Findings are overall suspicious for small bowel ischemia with secondary adynamic ileus. Correlate with lactate levels and clinical exam. These results were called by telephone at the time of interpretation on 09/21/2013 at 11:53 PM to Dr. Bebe Shaggy , who verbally acknowledged these results. 2. Coronary artery atherosclerosis.   Electronically Signed   By: Jeronimo Greaves M.D.   On: 09/21/2013 23:55   Dg Abd 2 Views  09/22/2013   CLINICAL DATA:  Vomiting with diarrhea for 5 days.  EXAM: ABDOMEN - 2 VIEW  COMPARISON:  Radiographs and CT 09/21/2013.  FINDINGS: Nasogastric tube has been placed into the proximal stomach. There is persistent moderate proximal to mid small bowel distension. The colon remains relatively decompressed. There is mild small bowel wall thickening. No free intraperitoneal air or portal venous gas is apparent radiographically. Mild lumbar spondylosis is noted.  IMPRESSION: No significant change in proximal small bowel distension following nasogastric tube placement. Pattern remains obstructive, although could be secondary to ischemic ileus as suggested on CT.   Electronically Signed   By: Roxy Horseman M.D.   On: 09/22/2013 12:50   Dg Abd Acute W/chest  09/21/2013   CLINICAL DATA:  Nausea vomiting and diarrhea for 4 days.  EXAM: ACUTE ABDOMEN SERIES (ABDOMEN 2 VIEW & CHEST 1 VIEW)  COMPARISON:  Chest x-ray 06/2013 and 05/31/2008  FINDINGS: Examination demonstrates the lungs to be adequately inflated without consolidation or effusion. The cardiomediastinal silhouette and remainder of the thorax is unchanged.  Abdominal pelvic images demonstrate multiple air-filled dilated small bowel loops measuring up to 6 cm in diameter. There are several scattered air-fluid  levels. There is no free peritoneal air. There are degenerative changes of the spine and hips.  IMPRESSION: Multiple air-filled moderately dilated small bowel loops with air-fluid levels suggesting distal small bowel obstruction and less likely ileus.  No acute cardiopulmonary disease.   Electronically Signed   By: Elberta Fortis M.D.   On: 09/21/2013 21:02      Disposition and Follow-up:     Discharge Orders   Future Orders Complete By Expires   Diet - low sodium heart healthy  As directed    Discharge instructions  As directed    Comments:     Please avoid constipation.  Please HOLD your  BP medications (lisinopril and HCTZ for next 3-4 days) and follow-up with your PCP.   Increase activity slowly  As directed        DISPOSITION: Home DIET: Heart healthy diet  DISCHARGE FOLLOW-UP Follow-up Information   Follow up with Rudi HeapMOORE, DONALD, MD. Schedule an appointment as soon as possible for a visit in 10 days. (for hospital follow-up. Hold your BP meds until follow-up )    Specialty:  Family Medicine   Contact information:   [address] [place] [id number] [phone]       Time spent on Discharge: 35 minutes  Signed:   Amarie Viles M.D. [facility] 09/28/2013, 3:19 PM Pager: [phone]

## 2013-10-04 ENCOUNTER — Encounter: Payer: Self-pay | Admitting: Family Medicine

## 2013-10-04 ENCOUNTER — Ambulatory Visit: Payer: Medicare HMO | Admitting: Family Medicine

## 2013-10-04 ENCOUNTER — Ambulatory Visit (INDEPENDENT_AMBULATORY_CARE_PROVIDER_SITE_OTHER): Payer: Medicare HMO | Admitting: Family Medicine

## 2013-10-04 VITALS — BP 161/82 | HR 94 | Temp 97.1°F | Ht 66.0 in | Wt 206.0 lb

## 2013-10-04 DIAGNOSIS — N179 Acute kidney failure, unspecified: Secondary | ICD-10-CM

## 2013-10-04 DIAGNOSIS — E669 Obesity, unspecified: Secondary | ICD-10-CM | POA: Insufficient documentation

## 2013-10-04 DIAGNOSIS — L97921 Non-pressure chronic ulcer of unspecified part of left lower leg limited to breakdown of skin: Secondary | ICD-10-CM

## 2013-10-04 DIAGNOSIS — L97911 Non-pressure chronic ulcer of unspecified part of right lower leg limited to breakdown of skin: Secondary | ICD-10-CM

## 2013-10-04 DIAGNOSIS — K56609 Unspecified intestinal obstruction, unspecified as to partial versus complete obstruction: Secondary | ICD-10-CM

## 2013-10-04 DIAGNOSIS — I10 Essential (primary) hypertension: Secondary | ICD-10-CM

## 2013-10-04 DIAGNOSIS — E7800 Pure hypercholesterolemia, unspecified: Secondary | ICD-10-CM

## 2013-10-04 DIAGNOSIS — K559 Vascular disorder of intestine, unspecified: Secondary | ICD-10-CM

## 2013-10-04 DIAGNOSIS — E860 Dehydration: Secondary | ICD-10-CM

## 2013-10-04 DIAGNOSIS — R600 Localized edema: Secondary | ICD-10-CM | POA: Insufficient documentation

## 2013-10-04 DIAGNOSIS — L97909 Non-pressure chronic ulcer of unspecified part of unspecified lower leg with unspecified severity: Secondary | ICD-10-CM

## 2013-10-04 DIAGNOSIS — R609 Edema, unspecified: Secondary | ICD-10-CM

## 2013-10-04 NOTE — Patient Instructions (Signed)
      Dr Wednesday Ericsson's Recommendations  For nutrition information, I recommend books:  1).Eat to Live by Dr Joel Fuhrman. 2).Prevent and Reverse Heart Disease by Dr Caldwell Esselstyn. 3) Dr Neal Barnard's Book:  Program to Reverse Diabetes  Exercise recommendations are:  If unable to walk, then the patient can exercise in a chair 3 times a day. By flapping arms like a bird gently and raising legs outwards to the front.  If ambulatory, the patient can go for walks for 30 minutes 3 times a week. Then increase the intensity and duration as tolerated.  Goal is to try to attain exercise frequency to 5 times a week.  If applicable: Best to perform resistance exercises (machines or weights) 2 days a week and cardio type exercises 3 days per week.  

## 2013-10-04 NOTE — Progress Notes (Signed)
Patient ID: Miguel Hardy, male   DOB: [birth year], 80 y.o.   MRN: [id number] SUBJECTIVE: CC: Chief Complaint  Patient presents with  . Acute Visit    left leg pain  red and swollen onset since jan 1st 2015 states goes to the [facility] every 6  months    HPI: Follow up on ischemic bowel with PSBO.   Patient is here for follow up of hyperlipidemia/HTN: denies Headache;denies Chest Pain;denies weakness;denies Shortness of Breath and orthopnea;denies Visual changes;denies palpitations;denies cough;denies pedal edema;denies symptoms of TIA or stroke;deniesClaudication symptoms. admits to Compliance with medications; denies Problems with medications.   Breakfast: rice crispies, milk 2% Lunch: peaches cottage cheese and lettuce Supper: soup vegetables and beef  Both legs  Swollen with breaking in the skin and early oozing of fluid like it is blistering.   Past Medical History  Diagnosis Date  . Prostate enlargement   . Hypertension   . Hyperlipidemia   . Obesity    Past Surgical History  Procedure Laterality Date  . Appendectomy     History   Social History  . Marital Status: Widowed    Spouse Name: N/A    Number of Children: N/A  . Years of Education: N/A   Occupational History  . Not on file.   Social History Main Topics  . Smoking status: Former Smoker -- 1.00 packs/day    Types: Cigarettes    Start date: 02/08/1948    Quit date: 09/22/1974  . Smokeless tobacco: Not on file  . Alcohol Use: No  . Drug Use: No  . Sexual Activity: Not on file   Other Topics Concern  . Not on file   Social History Narrative  . No narrative on file   No family history on file. Current Outpatient Prescriptions on File Prior to Visit  Medication Sig Dispense Refill  . atorvastatin (LIPITOR) 40 MG tablet Take 40 mg by mouth daily.      . ciprofloxacin (CIPRO) 500 MG tablet Take 1 tablet (500 mg total) by mouth 2 (two) times daily. X 1 week  14 tablet  0  . dicyclomine (BENTYL) 10  MG capsule Take 10 mg by mouth 4 (four) times daily -  before meals and at bedtime.      . diphenoxylate-atropine (LOMOTIL) 2.5-0.025 MG per tablet Take 1 tablet by mouth as needed for diarrhea or loose stools.       Marland Kitchen lisinopril (PRINIVIL,ZESTRIL) 40 MG tablet Take 40 mg by mouth daily.      . metroNIDAZOLE (FLAGYL) 500 MG tablet Take 1 tablet (500 mg total) by mouth 3 (three) times daily. X 1 week  21 tablet  0  . pantoprazole (PROTONIX) 40 MG tablet Take 1 tablet (40 mg total) by mouth daily.  30 tablet  3  . polyethylene glycol (MIRALAX) packet Take 17 g by mouth daily as needed for mild constipation or moderate constipation.  30 each  3  . promethazine (PHENERGAN) 12.5 MG tablet Take 1 tablet (12.5 mg total) by mouth every 6 (six) hours as needed for nausea or vomiting.  30 tablet  0  . psyllium (METAMUCIL) 0.52 G capsule Take 1 capsule (0.52 g total) by mouth daily. (Also available OTC)  30 capsule  4   No current facility-administered medications on file prior to visit.   Allergies  Allergen Reactions  . Flomax [Tamsulosin Hcl]    Immunization History  Administered Date(s) Administered  . Influenza-Unspecified 10/04/2013   Prior to Admission medications  Medication Sig Start Date End Date Taking? Authorizing Provider  atorvastatin (LIPITOR) 40 MG tablet Take 40 mg by mouth daily.   Yes Historical Provider, MD  ciprofloxacin (CIPRO) 500 MG tablet Take 1 tablet (500 mg total) by mouth 2 (two) times daily. X 1 week 09/28/13  Yes Ripudeep Krystal Eaton, MD  dicyclomine (BENTYL) 10 MG capsule Take 10 mg by mouth 4 (four) times daily -  before meals and at bedtime. 09/20/13  Yes Historical Provider, MD  diphenoxylate-atropine (LOMOTIL) 2.5-0.025 MG per tablet Take 1 tablet by mouth as needed for diarrhea or loose stools.  09/20/13  Yes Historical Provider, MD  lisinopril (PRINIVIL,ZESTRIL) 40 MG tablet Take 40 mg by mouth daily.   Yes Historical Provider, MD  metroNIDAZOLE (FLAGYL) 500 MG tablet  Take 1 tablet (500 mg total) by mouth 3 (three) times daily. X 1 week 09/28/13  Yes Ripudeep K Rai, MD  pantoprazole (PROTONIX) 40 MG tablet Take 1 tablet (40 mg total) by mouth daily. 09/28/13  Yes Ripudeep Krystal Eaton, MD  polyethylene glycol (MIRALAX) packet Take 17 g by mouth daily as needed for mild constipation or moderate constipation. 09/28/13  Yes Ripudeep Krystal Eaton, MD  promethazine (PHENERGAN) 12.5 MG tablet Take 1 tablet (12.5 mg total) by mouth every 6 (six) hours as needed for nausea or vomiting. 09/28/13  Yes Ripudeep Krystal Eaton, MD  psyllium (METAMUCIL) 0.52 G capsule Take 1 capsule (0.52 g total) by mouth daily. (Also available OTC) 09/28/13  Yes Ripudeep K Rai, MD     ROS: As above in the HPI. All other systems are stable or negative.  OBJECTIVE: APPEARANCE:  Patient in no acute distress.The patient appeared well nourished and normally developed. Acyanotic. Waist: VITAL SIGNS:BP 161/82  Pulse 94  Temp(Src) 97.1 F (36.2 C) (Oral)  Ht '5\' 6"'  (1.676 m)  Wt 206 lb (93.441 kg)  BMI 33.27 kg/m2 Obese WM  SKIN: warm and  Dry without overt rashes, tattoos and scars  HEAD and Neck: without JVD, Head and scalp: normal Eyes:No scleral icterus. Fundi normal, eye movements normal. Ears: Auricle normal, canal normal, Tympanic membranes normal, insufflation normal. Nose: normal Throat: normal Neck & thyroid: normal  CHEST & LUNGS: Chest wall: normal Lungs: Clear  CVS: Reveals the PMI to be normally located. Regular rhythm, First and Second Heart sounds are normal,  absence of murmurs, rubs or gallops. Peripheral vasculature: Radial pulses: normal Dorsal pedis pulses: normal Posterior pulses: normal  ABDOMEN:  Appearance: Obese Benign, no organomegaly, no masses, no Abdominal Aortic enlargement. No Guarding , no rebound. No Bruits. Bowel sounds: normal  RECTAL: N/A GU: N/A  EXTREMETIES: 4+ edematous.bilaterally right worse than left with skin breaking from the tension of the edema  of both legs and slight oozing of the left leg.  MUSCULOSKELETAL:  Spine:reduced ROMJoints: intact  NEUROLOGIC: oriented to time,place and person; nonfocal.  ASSESSMENT:  HTN (hypertension), benign - Plan: Brain natriuretic peptide, CMP14+EGFR, TSH  Hypercholesterolemia - Plan: NMR, lipoprofile  ARF (acute renal failure)  SBO (small bowel obstruction)  Small bowel ischemia  Dehydration  Ulcers of both lower legs, limited to breakdown of skin  Pedal edema - Plan: POCT CBC, Brain natriuretic peptide  Obesity - Plan: TSH  PLAN:      Dr Paula Libra Recommendations  For nutrition information, I recommend books:  1).Eat to Live by Dr Excell Seltzer. 2).Prevent and Reverse Heart Disease by Dr Karl Luke. 3) Dr Janene Harvey Book:  Program to Reverse Diabetes  Exercise recommendations  are:  If unable to walk, then the patient can exercise in a chair 3 times a day. By flapping arms like a bird gently and raising legs outwards to the front.  If ambulatory, the patient can go for walks for 30 minutes 3 times a week. Then increase the intensity and duration as tolerated.  Goal is to try to attain exercise frequency to 5 times a week.  If applicable: Best to perform resistance exercises (machines or weights) 2 days a week and cardio type exercises 3 days per week.  Orders Placed This Encounter  Procedures  . Brain natriuretic peptide  . CMP14+EGFR  . NMR, lipoprofile  . TSH  . POCT CBC  bilateral UNNA boots. counseled on need for aggressive dietary changes and weight loss.    No orders of the defined types were placed in this encounter.   There are no discontinued medications. Return in about 1 week (around 10/11/2013) for Recheck medical problems, leg ulcers.Dub Mikes P. Jacelyn Grip, M.D.

## 2013-10-10 ENCOUNTER — Ambulatory Visit (INDEPENDENT_AMBULATORY_CARE_PROVIDER_SITE_OTHER): Payer: Medicare HMO | Admitting: Family Medicine

## 2013-10-10 ENCOUNTER — Encounter: Payer: Self-pay | Admitting: Family Medicine

## 2013-10-10 ENCOUNTER — Ambulatory Visit: Payer: Medicare HMO | Admitting: Family Medicine

## 2013-10-10 VITALS — BP 119/72 | HR 86 | Temp 97.4°F | Ht 66.0 in | Wt 196.4 lb

## 2013-10-10 DIAGNOSIS — E669 Obesity, unspecified: Secondary | ICD-10-CM

## 2013-10-10 DIAGNOSIS — I10 Essential (primary) hypertension: Secondary | ICD-10-CM

## 2013-10-10 DIAGNOSIS — R609 Edema, unspecified: Secondary | ICD-10-CM

## 2013-10-10 DIAGNOSIS — K56609 Unspecified intestinal obstruction, unspecified as to partial versus complete obstruction: Secondary | ICD-10-CM

## 2013-10-10 DIAGNOSIS — N179 Acute kidney failure, unspecified: Secondary | ICD-10-CM

## 2013-10-10 DIAGNOSIS — E7800 Pure hypercholesterolemia, unspecified: Secondary | ICD-10-CM

## 2013-10-10 DIAGNOSIS — L97909 Non-pressure chronic ulcer of unspecified part of unspecified lower leg with unspecified severity: Secondary | ICD-10-CM

## 2013-10-10 DIAGNOSIS — L97921 Non-pressure chronic ulcer of unspecified part of left lower leg limited to breakdown of skin: Principal | ICD-10-CM

## 2013-10-10 DIAGNOSIS — L97911 Non-pressure chronic ulcer of unspecified part of right lower leg limited to breakdown of skin: Secondary | ICD-10-CM

## 2013-10-10 DIAGNOSIS — R600 Localized edema: Secondary | ICD-10-CM

## 2013-10-10 DIAGNOSIS — K559 Vascular disorder of intestine, unspecified: Secondary | ICD-10-CM

## 2013-10-10 NOTE — Patient Instructions (Signed)
Medium pressure Knee high stockings ( 20 to 30 mm HG) Can be fitted and purchased at [facility].

## 2013-10-10 NOTE — Progress Notes (Signed)
Patient ID: Miguel Hardy, male   DOB: [birth year], 80 y.o.   MRN: [id number] SUBJECTIVE: CC: Chief Complaint  Patient presents with  . Follow-up    reck legs     HPI:  Here to follow up on the pedal edema/ venous stasis. With leg ulcerations. Had [employer] [facility]. Legs better now. Did not get his labs done last week. Feels fine. Bowels doing okay Patient is here for follow up of hyperlipidemia/HTN: denies Headache;denies Chest Pain;denies weakness;denies Shortness of Breath and orthopnea;denies Visual changes;denies palpitations;denies cough;denies pedal edema;denies symptoms of TIA or stroke;deniesClaudication symptoms. admits to Compliance with medications; denies Problems with medications.   Past Medical History  Diagnosis Date  . Prostate enlargement   . Hypertension   . Hyperlipidemia   . Obesity    Past Surgical History  Procedure Laterality Date  . Appendectomy     History   Social History  . Marital Status: Widowed    Spouse Name: N/A    Number of Children: N/A  . Years of Education: N/A   Occupational History  . Not on file.   Social History Main Topics  . Smoking status: Former Smoker -- 1.00 packs/day    Types: Cigarettes    Start date: 02/08/1948    Quit date: 09/22/1974  . Smokeless tobacco: Not on file  . Alcohol Use: No  . Drug Use: No  . Sexual Activity: Not on file   Other Topics Concern  . Not on file   Social History Narrative  . No narrative on file   No family history on file. Current Outpatient Prescriptions on File Prior to Visit  Medication Sig Dispense Refill  . atorvastatin (LIPITOR) 40 MG tablet Take 40 mg by mouth daily.      Marland Kitchen dicyclomine (BENTYL) 10 MG capsule Take 10 mg by mouth 4 (four) times daily -  before meals and at bedtime.      . diphenoxylate-atropine (LOMOTIL) 2.5-0.025 MG per tablet Take 1 tablet by mouth as needed for diarrhea or loose stools.       Marland Kitchen lisinopril (PRINIVIL,ZESTRIL) 40 MG tablet Take 40 mg by mouth  daily.      . metroNIDAZOLE (FLAGYL) 500 MG tablet Take 1 tablet (500 mg total) by mouth 3 (three) times daily. X 1 week  21 tablet  0  . pantoprazole (PROTONIX) 40 MG tablet Take 1 tablet (40 mg total) by mouth daily.  30 tablet  3  . polyethylene glycol (MIRALAX) packet Take 17 g by mouth daily as needed for mild constipation or moderate constipation.  30 each  3  . promethazine (PHENERGAN) 12.5 MG tablet Take 1 tablet (12.5 mg total) by mouth every 6 (six) hours as needed for nausea or vomiting.  30 tablet  0  . psyllium (METAMUCIL) 0.52 G capsule Take 1 capsule (0.52 g total) by mouth daily. (Also available OTC)  30 capsule  4  . ciprofloxacin (CIPRO) 500 MG tablet Take 1 tablet (500 mg total) by mouth 2 (two) times daily. X 1 week  14 tablet  0   No current facility-administered medications on file prior to visit.   Allergies  Allergen Reactions  . Flomax [Tamsulosin Hcl]    Immunization History  Administered Date(s) Administered  . Influenza-Unspecified 10/04/2013   Prior to Admission medications   Medication Sig Start Date End Date Taking? Authorizing Provider  atorvastatin (LIPITOR) 40 MG tablet Take 40 mg by mouth daily.   Yes Historical Provider, MD  dicyclomine (BENTYL) 10  MG capsule Take 10 mg by mouth 4 (four) times daily -  before meals and at bedtime. 09/20/13  Yes Historical Provider, MD  diphenoxylate-atropine (LOMOTIL) 2.5-0.025 MG per tablet Take 1 tablet by mouth as needed for diarrhea or loose stools.  09/20/13  Yes Historical Provider, MD  HYDROcodone-acetaminophen (NORCO/VICODIN) 5-325 MG per tablet  07/11/13  Yes Historical Provider, MD  lisinopril (PRINIVIL,ZESTRIL) 40 MG tablet Take 40 mg by mouth daily.   Yes Historical Provider, MD  metroNIDAZOLE (FLAGYL) 500 MG tablet Take 1 tablet (500 mg total) by mouth 3 (three) times daily. X 1 week 09/28/13  Yes Ripudeep Jenna Luo, MD  naproxen (NAPROSYN) 500 MG tablet  07/11/13  Yes Historical Provider, MD  pantoprazole  (PROTONIX) 40 MG tablet Take 1 tablet (40 mg total) by mouth daily. 09/28/13  Yes Ripudeep Jenna Luo, MD  polyethylene glycol (MIRALAX) packet Take 17 g by mouth daily as needed for mild constipation or moderate constipation. 09/28/13  Yes Ripudeep Jenna Luo, MD  promethazine (PHENERGAN) 12.5 MG tablet Take 1 tablet (12.5 mg total) by mouth every 6 (six) hours as needed for nausea or vomiting. 09/28/13  Yes Ripudeep Jenna Luo, MD  psyllium (METAMUCIL) 0.52 G capsule Take 1 capsule (0.52 g total) by mouth daily. (Also available OTC) 09/28/13  Yes Ripudeep Jenna Luo, MD  ciprofloxacin (CIPRO) 500 MG tablet Take 1 tablet (500 mg total) by mouth 2 (two) times daily. X 1 week 09/28/13   Ripudeep Jenna Luo, MD     ROS: As above in the HPI. All other systems are stable or negative.  OBJECTIVE: APPEARANCE:  Patient in no acute distress.The patient appeared well nourished and normally developed. Acyanotic. Waist: VITAL SIGNS:BP 119/72  Pulse 86  Temp(Src) 97.4 F (36.3 C) (Oral)  Ht 5\' 6"  (1.676 m)  Wt 196 lb 6.4 oz (89.086 kg)  BMI 31.71 kg/m2  WM obese WM  SKIN: warm and  Dry without overt rashes, tattoos and scars  HEAD and Neck: without JVD, Head and scalp: normal Eyes:No scleral icterus. Fundi normal, eye movements normal. Ears: Auricle normal, canal normal, Tympanic membranes normal, insufflation normal. Nose: normal Throat: normal Neck & thyroid: normal  CHEST & LUNGS: Chest wall: normal Lungs: Clear  CVS: Reveals the PMI to be normally located. Regular rhythm, First and Second Heart sounds are normal,  absence of murmurs, rubs or gallops. Peripheral vasculature: Radial pulses: normal Dorsal pedis pulses: normal Posterior pulses: normal  ABDOMEN:  Appearance: normal Benign, no organomegaly, no masses, no Abdominal Aortic enlargement. No Guarding , no rebound. No Bruits. Bowel sounds: normal  RECTAL: N/A GU: N/A  EXTREMETIES:  1+ edematous bilaterally. The leg ulcers have  healed.   MUSCULOSKELETAL:  Spine: normal Joints: intact  NEUROLOGIC: oriented to time,place and person; nonfocal.  ASSESSMENT:  Ulcers of both lower legs, limited to breakdown of skin - healed   SBO (small bowel obstruction)  Small bowel ischemia  Pedal edema  Obesity  Hypercholesterolemia  ARF (acute renal failure)  HTN (hypertension), benign  PLAN: Medium pressure Knee high stockings ( 20 to 30 mm HG) Can be fitted and purchased at [employer].  Healthy diet. Labs to be  Done today ordered last week but not done. No orders of the defined types were placed in this encounter.   Meds ordered this encounter  Medications  . HYDROcodone-acetaminophen (NORCO/VICODIN) 5-325 MG per tablet    Sig:   . naproxen (NAPROSYN) 500 MG tablet    Sig:  There are no discontinued medications. Return in about 4 weeks (around 11/07/2013) for Recheck medical problems.  Breelyn Icard P. Modesto CharonWong, M.D.

## 2013-10-10 NOTE — Addendum Note (Signed)
Addended by: Prescott GumLAND, Dekari Bures M on: 10/10/2013 04:46 PM   Modules accepted: Orders

## 2013-10-12 LAB — NMR, LIPOPROFILE
Cholesterol: 137 mg/dL (ref <200)
HDL Cholesterol by NMR: 35 mg/dL — ABNORMAL LOW (ref >=40)
HDL Particle Number: 27.5 umol/L — ABNORMAL LOW (ref >=30.5)
LDL Particle Number: 595 nmol/L (ref <1000)
LDL Size: 20.5 nm — ABNORMAL LOW (ref >20.5)
LDLC SERPL CALC-MCNC: 55 mg/dL (ref <100)
LP-IR Score: 51 — ABNORMAL HIGH (ref <=45)
Small LDL Particle Number: 419 nmol/L (ref <=527)
Triglycerides by NMR: 234 mg/dL — ABNORMAL HIGH (ref <150)

## 2013-10-12 LAB — CMP14+EGFR
ALT: 16 IU/L (ref 0–44)
AST: 18 IU/L (ref 0–40)
Albumin/Globulin Ratio: 1.8 (ref 1.1–2.5)
Albumin: 4.3 g/dL (ref 3.5–4.7)
Alkaline Phosphatase: 61 IU/L (ref 39–117)
BUN/Creatinine Ratio: 17 (ref 10–22)
BUN: 21 mg/dL (ref 8–27)
CO2: 25 mmol/L (ref 18–29)
Calcium: 9.6 mg/dL (ref 8.6–10.2)
Chloride: 99 mmol/L (ref 97–108)
Creatinine, Ser: 1.22 mg/dL (ref 0.76–1.27)
GFR calc Af Amer: 64 mL/min/{1.73_m2} (ref >59)
GFR calc non Af Amer: 55 mL/min/{1.73_m2} — ABNORMAL LOW (ref >59)
Globulin, Total: 2.4 g/dL (ref 1.5–4.5)
Glucose: 103 mg/dL — ABNORMAL HIGH (ref 65–99)
Potassium: 4 mmol/L (ref 3.5–5.2)
Sodium: 140 mmol/L (ref 134–144)
Total Bilirubin: 0.4 mg/dL (ref 0.0–1.2)
Total Protein: 6.7 g/dL (ref 6.0–8.5)

## 2013-10-12 LAB — CBC WITH DIFFERENTIAL
Basophils Absolute: 0.1 10*3/uL (ref 0.0–0.2)
Basos: 1 %
Eos: 3 %
Eosinophils Absolute: 0.2 10*3/uL (ref 0.0–0.4)
HCT: 35.9 % — ABNORMAL LOW (ref 37.5–51.0)
Hemoglobin: 12 g/dL — ABNORMAL LOW (ref 12.6–17.7)
Immature Grans (Abs): 0 10*3/uL (ref 0.0–0.1)
Immature Granulocytes: 0 %
Lymphocytes Absolute: 1.2 10*3/uL (ref 0.7–3.1)
Lymphs: 20 %
MCH: 29.8 pg (ref 26.6–33.0)
MCHC: 33.4 g/dL (ref 31.5–35.7)
MCV: 89 fL (ref 79–97)
Monocytes Absolute: 0.5 10*3/uL (ref 0.1–0.9)
Monocytes: 8 %
Neutrophils Absolute: 4.1 10*3/uL (ref 1.4–7.0)
Neutrophils Relative %: 68 %
Platelets: 449 10*3/uL — ABNORMAL HIGH (ref 150–379)
RBC: 4.03 x10E6/uL — ABNORMAL LOW (ref 4.14–5.80)
RDW: 14.8 % (ref 12.3–15.4)
WBC: 6.1 10*3/uL (ref 3.4–10.8)

## 2013-10-12 LAB — BRAIN NATRIURETIC PEPTIDE: BNP: 22.2 pg/mL (ref 0.0–100.0)

## 2013-10-12 LAB — TSH: TSH: 3.88 u[IU]/mL (ref 0.450–4.500)

## 2013-10-13 ENCOUNTER — Ambulatory Visit (INDEPENDENT_AMBULATORY_CARE_PROVIDER_SITE_OTHER): Payer: Medicare HMO | Admitting: Family Medicine

## 2013-10-13 ENCOUNTER — Encounter: Payer: Self-pay | Admitting: Family Medicine

## 2013-10-13 VITALS — BP 117/65 | HR 60 | Temp 98.0°F | Ht 66.0 in | Wt 197.6 lb

## 2013-10-13 DIAGNOSIS — Z209 Contact with and (suspected) exposure to unspecified communicable disease: Secondary | ICD-10-CM

## 2013-10-13 DIAGNOSIS — Z23 Encounter for immunization: Secondary | ICD-10-CM

## 2013-10-13 MED ORDER — AZITHROMYCIN 250 MG PO TABS: ORAL_TABLET | ORAL | Status: DC

## 2013-10-13 NOTE — Progress Notes (Signed)
   Subjective:    Patient ID: Urbano HeirBobby L Driver, male    DOB: 06/25/1930, 80 y.o.   MRN: [id number]  HPI  This 80 y.o. male presents for evaluation of cough.  He was called by the hospital and told he was exposed to whooping cough and should get checked.  He has been coughing.  Review of Systems No chest pain, SOB, HA, dizziness, vision change, N/V, diarrhea, constipation, dysuria, urinary urgency or frequency, myalgias, arthralgias or rash.     Objective:   Physical Exam  Vital signs noted  Well developed well nourished male.  HEENT - Head atraumatic Normocephalic                Eyes - PERRLA, Conjuctiva - clear Sclera- Clear EOMI                Ears - EAC's Wnl TM's Wnl Gross Hearing WNL                Nose - Nares patent                 Throat - oropharanx wnl Respiratory - Lungs CTA bilateral Cardiac - RRR S1 and S2 without murmur GI - Abdomen soft Nontender and bowel sounds active x 4 Extremities - No edema. Neuro - Grossly intact.      Assessment & Plan:  Need for Tdap vaccination - Plan: Tdap vaccine greater than or equal to 7yo IM  URI - Zpak as direted.  Push po fluids, rest, tylenol and motrin otc prn as directed for fever, arthralgias, and myalgias.  Follow up prn if sx's continue or persist.  Deatra CanterWilliam J Oxford FNP

## 2013-10-13 NOTE — Progress Notes (Signed)
Patient was exposed to pertusis at a [facility]. He spoke with Adria DillKatrina Jackson at [phone] who said that he should seek treatment at primary care or health department and that [facility] would take care of the cost. I left a message for Katrina to return my call.--K. Hudy, RN  Unable to reach communic[facility] this afternoon because she was out of the office. [facility] but was unable to speak with anyone. Left message for them to call back ASAP regarding treatment protocol.

## 2013-10-13 NOTE — Patient Instructions (Signed)

## 2013-10-16 NOTE — Progress Notes (Signed)
Quick Note:  Call Patient Labs that are abnormal: Hemoglobin dropped 2 points Triglycerides were high  The rest are stable.  Recommendations: Keep the follow up. Will discuss then.   ______

## 2013-11-10 ENCOUNTER — Ambulatory Visit: Payer: Medicare HMO | Admitting: Family Medicine

## 2014-12-02 ENCOUNTER — Encounter: Payer: Self-pay | Admitting: Nurse Practitioner

## 2014-12-02 ENCOUNTER — Ambulatory Visit (INDEPENDENT_AMBULATORY_CARE_PROVIDER_SITE_OTHER): Payer: Commercial Managed Care - HMO | Admitting: Nurse Practitioner

## 2014-12-02 VITALS — BP 142/86 | HR 100 | Temp 97.9°F | Ht 68.5 in | Wt 201.0 lb

## 2014-12-02 DIAGNOSIS — J0110 Acute frontal sinusitis, unspecified: Secondary | ICD-10-CM | POA: Diagnosis not present

## 2014-12-02 MED ORDER — AZITHROMYCIN 250 MG PO TABS: ORAL_TABLET | ORAL | Status: DC

## 2014-12-02 NOTE — Patient Instructions (Signed)

## 2014-12-02 NOTE — Progress Notes (Signed)
  Subjective:     Miguel Hardy is a 81 y.o. male who presents for evaluation of sinus pain. Symptoms include: congestion, cough, facial pain, headaches and nasal congestion. Onset of symptoms was 4 days ago. Symptoms have been gradually improving since that time. Past history is significant for no history of pneumonia or bronchitis. Patient is a non-smoker.  The following portions of the patient's history were reviewed and updated as appropriate: allergies, current medications, past family history, past medical history, past social history, past surgical history and problem list.  Review of Systems Pertinent items are noted in HPI.   Objective:    BP 142/86 mmHg  Pulse 100  Temp(Src) 97.9 F (36.6 C) (Oral)  Ht 5' 8.5" (1.74 m)  Wt 201 lb (91.173 kg)  BMI 30.11 kg/m2 General appearance: alert and cooperative Eyes: conjunctivae/corneas clear. PERRL, EOM's intact. Fundi benign. Ears: normal TM's and external ear canals both ears Nose: clear discharge, moderate congestion, turbinates red bilateral frontal sinus tenderness Throat: lips, mucosa, and tongue normal; teeth and gums normal Lungs: clear to auscultation bilaterally Heart: regular rate and rhythm, S1, S2 normal, no murmur, click, rub or gallop Neurologic: Grossly normal    Assessment:    Acute bacterial sinusitis.    Plan:  1. Take meds as prescribed 2. Use a cool mist humidifier especially during the winter months and when heat has been humid. 3. Use saline nose sprays frequently 4. Saline irrigations of the nose can be very helpful if done frequently.  * 4X daily for 1 week*  * Use of a nettie pot can be helpful with this. Follow directions with this* 5. Drink plenty of fluids 6. Keep thermostat turn down low 7.For any cough or congestion  Use plain Mucinex- regular strength or max strength is fine   * Children- consult with Pharmacist for dosing 8. For fever or aces or pains- take tylenol or ibuprofen appropriate  for age and weight.  * for fevers greater than 101 orally you may alternate ibuprofen and tylenol every  3 hours.   Meds ordered this encounter  Medications  . azithromycin (ZITHROMAX) 250 MG tablet    Sig: Two tablets day one, then one tablet daily next 4 days.    Dispense:  6 tablet    Refill:  0    Order Specific Question:  Supervising Provider    Answer:  Ernestina PennaMOORE, DONALD W [[id number]]   Mary-Margaret Daphine DeutscherMartin, FNP

## 2015-03-23 HISTORY — PX: CATARACT EXTRACTION, BILATERAL: SHX1313

## 2015-05-25 ENCOUNTER — Ambulatory Visit (INDEPENDENT_AMBULATORY_CARE_PROVIDER_SITE_OTHER): Payer: Commercial Managed Care - HMO | Admitting: Family Medicine

## 2015-05-25 ENCOUNTER — Encounter: Payer: Self-pay | Admitting: Family Medicine

## 2015-05-25 VITALS — BP 148/82 | HR 78 | Temp 97.5°F | Ht 68.5 in | Wt 199.4 lb

## 2015-05-25 DIAGNOSIS — E7800 Pure hypercholesterolemia, unspecified: Secondary | ICD-10-CM

## 2015-05-25 DIAGNOSIS — N400 Enlarged prostate without lower urinary tract symptoms: Secondary | ICD-10-CM | POA: Diagnosis not present

## 2015-05-25 DIAGNOSIS — I10 Essential (primary) hypertension: Secondary | ICD-10-CM | POA: Diagnosis not present

## 2015-05-25 DIAGNOSIS — M7989 Other specified soft tissue disorders: Secondary | ICD-10-CM

## 2015-05-25 DIAGNOSIS — R7301 Impaired fasting glucose: Secondary | ICD-10-CM

## 2015-05-25 LAB — POCT GLYCOSYLATED HEMOGLOBIN (HGB A1C): Hemoglobin A1C: 6

## 2015-05-25 NOTE — Progress Notes (Signed)
BP 148/82 mmHg  Pulse 78  Temp(Src) 97.5 F (36.4 C) (Oral)  Ht 5' 8.5" (1.74 m)  Wt 199 lb 6.4 oz (90.447 kg)  BMI 29.87 kg/m2   Subjective:    Patient ID: Miguel Hardy, male    DOB: 01-Sep-1930, 81 y.o.   MRN: [id number]  HPI: Miguel Hardy is a 81 y.o. male presenting on 05/25/2015 for Labwork and Medication Refill   HPI Hypertension Patient presents for hypertension recheck, he is currently taking lisinopril hydrochlorothiazide. He denies any side effects from medication, he denies any chest pains, shortness of breath, dizziness, lightheadedness. Denies any headaches. Has been on this medication at this dose for a while.  Hyperlipidemia Patient currently takes pravastatin for his cholesterol. He has not had a recheck in a year. He denies any myalgias.  Elevated fasting glucose Patient had elevated fasting glucose on his last lab value, we will check an A1c today.  Relevant past medical, surgical, family and social history reviewed and updated as indicated. Interim medical history since our last visit reviewed. Allergies and medications reviewed and updated.  Review of Systems  Constitutional: Negative for fever, chills and fatigue.  HENT: Negative for congestion, ear discharge and ear pain.   Eyes: Negative for discharge and visual disturbance.  Respiratory: Negative for shortness of breath and wheezing.   Cardiovascular: Positive for palpitations. Negative for chest pain and leg swelling.  Gastrointestinal: Negative for abdominal pain, diarrhea and constipation.  Genitourinary: Negative for difficulty urinating.  Musculoskeletal: Negative for back pain and gait problem.  Skin: Negative for color change and rash.  Neurological: Negative for dizziness, syncope, light-headedness and headaches.  All other systems reviewed and are negative.   Per HPI unless specifically indicated above    Medication List       This list is accurate as of: 05/25/15  8:43 AM.  Always use  your most recent med list.               aspirin 325 MG tablet  Take 325 mg by mouth daily.     hydrochlorothiazide 25 MG tablet  Commonly known as:  HYDRODIURIL  Take 25 mg by mouth daily. Take 1/2 tablet daily     HYDROcodone-acetaminophen 5-325 MG per tablet  Commonly known as:  NORCO/VICODIN  Take 1 tablet by mouth every 6 (six) hours as needed for moderate pain.     lisinopril 40 MG tablet  Commonly known as:  PRINIVIL,ZESTRIL  Take 40 mg by mouth daily. Take 1/2 tablet daily     pravastatin 20 MG tablet  Commonly known as:  PRAVACHOL  Take 20 mg by mouth daily.           Objective:    BP 148/82 mmHg  Pulse 78  Temp(Src) 97.5 F (36.4 C) (Oral)  Ht 5' 8.5" (1.74 m)  Wt 199 lb 6.4 oz (90.447 kg)  BMI 29.87 kg/m2  Wt Readings from Last 3 Encounters:  05/25/15 199 lb 6.4 oz (90.447 kg)  12/02/14 201 lb (91.173 kg)  10/13/13 197 lb 9.6 oz (89.631 kg)    Physical Exam  Constitutional: He is oriented to person, place, and time. He appears well-developed and well-nourished. No distress.  HENT:  Right Ear: External ear normal.  Left Ear: External ear normal.  Nose: Nose normal.  Mouth/Throat: Oropharynx is clear and moist.  Eyes: Conjunctivae and EOM are normal. Pupils are equal, round, and reactive to light. Right eye exhibits no discharge. No scleral icterus.  Neck: Neck supple. No thyromegaly present.  Cardiovascular: Normal rate, regular rhythm, normal heart sounds and intact distal pulses.   No murmur heard. Pulmonary/Chest: Effort normal and breath sounds normal. No respiratory distress. He has no wheezes.  Abdominal: He exhibits no distension.  Musculoskeletal: Normal range of motion. He exhibits edema (1+ right side trace on left).  Lymphadenopathy:    He has no cervical adenopathy.  Neurological: He is alert and oriented to person, place, and time. Coordination normal.  Skin: Skin is warm and dry. No rash noted. He is not diaphoretic.  Psychiatric:  He has a normal mood and affect. His behavior is normal.  Vitals reviewed.   Results for orders placed or performed in visit on 10/04/13  Brain natriuretic peptide  Result Value Ref Range   BNP 22.2 0.0 - 100.0 pg/mL  CMP14+EGFR  Result Value Ref Range   Glucose 103 (H) 65 - 99 mg/dL   BUN 21 8 - 27 mg/dL   Creatinine, Ser 1.22 0.76 - 1.27 mg/dL   GFR calc non Af Amer 55 (L) >59 mL/min/1.73   GFR calc Af Amer 64 >59 mL/min/1.73   BUN/Creatinine Ratio 17 10 - 22   Sodium 140 134 - 144 mmol/L   Potassium 4.0 3.5 - 5.2 mmol/L   Chloride 99 97 - 108 mmol/L   CO2 25 18 - 29 mmol/L   Calcium 9.6 8.6 - 10.2 mg/dL   Total Protein 6.7 6.0 - 8.5 g/dL   Albumin 4.3 3.5 - 4.7 g/dL   Globulin, Total 2.4 1.5 - 4.5 g/dL   Albumin/Globulin Ratio 1.8 1.1 - 2.5   Total Bilirubin 0.4 0.0 - 1.2 mg/dL   Alkaline Phosphatase 61 39 - 117 IU/L   AST 18 0 - 40 IU/L   ALT 16 0 - 44 IU/L  NMR, lipoprofile  Result Value Ref Range   LDL Particle Number 595 <1000 nmol/L   LDLC SERPL CALC-MCNC 55 <100 mg/dL   HDL Cholesterol by NMR 35 (L) >=40 mg/dL   Triglycerides by NMR 234 (H) <150 mg/dL   Cholesterol 137 <200 mg/dL   HDL Particle Number 27.5 (L) >=30.5 umol/L   Small LDL Particle Number 419 <=527 nmol/L   LDL Size 20.5 (L) >20.5 nm   LP-IR Score 51 (H) <=45  TSH  Result Value Ref Range   TSH 3.880 0.450 - 4.500 uIU/mL  CBC With differential/Platelet  Result Value Ref Range   WBC 6.1 3.4 - 10.8 x10E3/uL   RBC 4.03 (L) 4.14 - 5.80 x10E6/uL   Hemoglobin 12.0 (L) 12.6 - 17.7 g/dL   HCT 35.9 (L) 37.5 - 51.0 %   MCV 89 79 - 97 fL   MCH 29.8 26.6 - 33.0 pg   MCHC 33.4 31.5 - 35.7 g/dL   RDW 14.8 12.3 - 15.4 %   Platelets 449 (H) 150 - 379 x10E3/uL   Neutrophils Relative % 68 %   Lymphs 20 %   Monocytes 8 %   Eos 3 %   Basos 1 %   Neutrophils Absolute 4.1 1.4 - 7.0 x10E3/uL   Lymphocytes Absolute 1.2 0.7 - 3.1 x10E3/uL   Monocytes Absolute 0.5 0.1 - 0.9 x10E3/uL   Eosinophils Absolute 0.2  0.0 - 0.4 x10E3/uL   Basophils Absolute 0.1 0.0 - 0.2 x10E3/uL   Immature Granulocytes 0 %   Immature Grans (Abs) 0.0 0.0 - 0.1 x10E3/uL      Assessment & Plan:   Problem List Items Addressed This Visit  Cardiovascular and Mediastinum   HTN (hypertension), benign - Primary (Chronic)    Patient has blood pressure of 148/82 which is within the [place] 8 standards for the elderly. He continues to take lisinopril and hydrochlorothiazide. He denies any side effects we will continue the meds for now.      Relevant Medications   hydrochlorothiazide (HYDRODIURIL) 25 MG tablet   aspirin 325 MG tablet   pravastatin (PRAVACHOL) 20 MG tablet   lisinopril (PRINIVIL,ZESTRIL) 40 MG tablet   Other Relevant Orders   BMP8+EGFR     Other   Hypercholesterolemia (Chronic)    Patient has elevated cholesterol and is being treated with pravastatin. He denies any issues with medication and we'll recheck his levels today.      Relevant Medications   hydrochlorothiazide (HYDRODIURIL) 25 MG tablet   aspirin 325 MG tablet   pravastatin (PRAVACHOL) 20 MG tablet   lisinopril (PRINIVIL,ZESTRIL) 40 MG tablet   Other Relevant Orders   Lipid panel    Other Visit Diagnoses    Elevated fasting glucose        Patient had elevated fasting glucose on his last lab we'll check an A1c.    Relevant Orders    POCT glycosylated hemoglobin (Hb A1C) (Completed)    BPH (benign prostatic hyperplasia)        Relevant Orders    PSA, total and free    Right leg swelling        Patient has right leg swelling 1+, we will check an ultrasound of that leg for DVTs    Relevant Orders    US Venous Img Lower Unilateral Right        Follow up plan: Return in about 6 months (around 11/22/2015), or if symptoms worsen or fail to improve.  Caryl Pina, MD [facility] 05/25/2015, 8:43 AM

## 2015-05-25 NOTE — Assessment & Plan Note (Signed)
Patient has elevated cholesterol and is being treated with pravastatin. He denies any issues with medication and we'll recheck his levels today.

## 2015-05-25 NOTE — Assessment & Plan Note (Signed)
Patient has blood pressure of 148/82 which is within the [place] 8 standards for the elderly. He continues to take lisinopril and hydrochlorothiazide. He denies any side effects we will continue the meds for now.

## 2015-05-26 LAB — LIPID PANEL
CHOLESTEROL TOTAL: 157 mg/dL (ref 100–199)
Chol/HDL Ratio: 3.8 ratio units (ref 0.0–5.0)
HDL: 41 mg/dL (ref >39)
LDL CALC: 75 mg/dL (ref 0–99)
TRIGLYCERIDES: 206 mg/dL — AB (ref 0–149)
VLDL CHOLESTEROL CAL: 41 mg/dL — AB (ref 5–40)

## 2015-05-26 LAB — PSA, TOTAL AND FREE
PSA FREE PCT: 10.5 %
PSA, Free: 2.18 ng/mL
Prostate Specific Ag, Serum: 20.7 ng/mL — ABNORMAL HIGH (ref 0.0–4.0)

## 2015-05-26 LAB — BMP8+EGFR
BUN / CREAT RATIO: 16 (ref 10–22)
BUN: 22 mg/dL (ref 8–27)
CALCIUM: 9.4 mg/dL (ref 8.6–10.2)
CO2: 24 mmol/L (ref 18–29)
Chloride: 99 mmol/L (ref 97–108)
Creatinine, Ser: 1.41 mg/dL — ABNORMAL HIGH (ref 0.76–1.27)
GFR calc non Af Amer: 46 mL/min/{1.73_m2} — ABNORMAL LOW (ref >59)
GFR, EST AFRICAN AMERICAN: 53 mL/min/{1.73_m2} — AB (ref >59)
GLUCOSE: 116 mg/dL — AB (ref 65–99)
POTASSIUM: 4.5 mmol/L (ref 3.5–5.2)
Sodium: 140 mmol/L (ref 134–144)

## 2015-05-31 ENCOUNTER — Telehealth: Payer: Self-pay

## 2015-05-31 NOTE — Telephone Encounter (Signed)
Yes please, sorry I did not know that it does not come to you.

## 2015-05-31 NOTE — Telephone Encounter (Signed)
I just came across an order for this man for a venous doppler from 92/16   This is one we can't see on workqueue  Do you still want this scheduled?

## 2015-06-04 ENCOUNTER — Ambulatory Visit (HOSPITAL_COMMUNITY)
Admission: RE | Admit: 2015-06-04 | Discharge: 2015-06-04 | Disposition: A | Discharge status: Home / Self Care | Payer: Commercial Managed Care - HMO | Source: Ambulatory Visit | Attending: Family Medicine | Admitting: Family Medicine

## 2015-06-04 DIAGNOSIS — M7989 Other specified soft tissue disorders: Secondary | ICD-10-CM | POA: Diagnosis not present

## 2015-06-04 DIAGNOSIS — M79604 Pain in right leg: Secondary | ICD-10-CM | POA: Insufficient documentation

## 2015-06-05 NOTE — Progress Notes (Signed)
No VM on home phone 

## 2015-07-03 ENCOUNTER — Telehealth: Payer: Self-pay | Admitting: Nurse Practitioner

## 2015-07-27 ENCOUNTER — Encounter: Payer: Self-pay | Admitting: Family Medicine

## 2015-07-27 ENCOUNTER — Ambulatory Visit (INDEPENDENT_AMBULATORY_CARE_PROVIDER_SITE_OTHER): Payer: Commercial Managed Care - HMO | Admitting: Family Medicine

## 2015-07-27 VITALS — BP 115/73 | HR 69 | Temp 97.1°F | Ht 68.5 in | Wt 198.2 lb

## 2015-07-27 DIAGNOSIS — J00 Acute nasopharyngitis [common cold]: Secondary | ICD-10-CM

## 2015-07-27 MED ORDER — BENZONATATE 100 MG PO CAPS: 100.0000 mg | ORAL_CAPSULE | Freq: Three times a day (TID) | ORAL | Status: DC | PRN

## 2015-07-27 MED ORDER — MOMETASONE FUROATE 50 MCG/ACT NA SUSP: 2.0000 | Freq: Two times a day (BID) | NASAL | Status: DC

## 2015-07-27 NOTE — Progress Notes (Signed)
   HPI  Patient presents today here for evaluation of cough and cold.  Patient explains that he's been ill for about 4 days. He reports cough, headache, sore throat, Renteria, and postnasal drip. About 3 months ago [place] was [place] in [place] and had always, shaking chills, and felt that his fever broke around 3 AM in the morning. Since that time he's had continued symptoms with no additional subjective fever. He states that it feels like it's "in his head"   he denies sinus pressure, ear pain, or shortness of breath. He has normal oral intake.  PMH: Smoking status noted ROS: Per HPI  Objective: BP 115/73 mmHg  Pulse 69  Temp(Src) 97.1 F (36.2 C) (Oral)  Ht 5' 8.5" (1.74 m)  Wt 198 lb 3.2 oz (89.903 kg)  BMI 29.69 kg/m2 Gen: NAD, alert, cooperative with exam HEENT: NCATTMs normal bilaterally after hearing aids removed, nares with mucus bilaterally, oropharynx clear CV: RRR, good S1/S2, no murmur Resp: CTABL, no wheezes, non-labored Abd: SNTND, BS present, no guarding or organomegaly Ext: No edema, warm Neuro: Alert and oriented, No gross deficits  Assessment and plan:  #  Common cold, upper respiratory infection Treatment with Nasonex twice a day while he's ill, discussed once daily dosing if continued Tessalon for cough Discussed reasons to return including worsening illness, dyspnea, return if fever or chills, or failure to resolve as expected.   Meds ordered this encounter  Medications  . mometasone (NASONEX) 50 MCG/ACT nasal spray    Sig: Place 2 sprays into the nose 2 (two) times daily.    Dispense:  17 g    Refill:  12  . benzonatate (TESSALON) 100 MG capsule    Sig: Take 1 capsule (100 mg total) by mouth 3 (three) times daily as needed for cough.    Dispense:  20 capsule    Refill:  0    Murtis SinkSam Bradshaw, MD [facility] 07/27/2015, 8:46 AM

## 2015-07-27 NOTE — Patient Instructions (Signed)
Great to meet you!  Try the nasonex twice daily for 1 week until your illness resolves, if you continue it for runny nose due to allergies only use it once daily  Try tessalon perles for cough  Upper Respiratory Infection, Adult Most upper respiratory infections (URIs) are a viral infection of the air passages leading to the lungs. A URI affects the nose, throat, and upper air passages. The most common type of URI is nasopharyngitis and is typically referred to as "the common cold." URIs run their course and usually go away on their own. Most of the time, a URI does not require medical attention, but sometimes a bacterial infection in the upper airways can follow a viral infection. This is called a secondary infection. Sinus and middle ear infections are common types of secondary upper respiratory infections. Bacterial pneumonia can also complicate a URI. A URI can worsen asthma and chronic obstructive pulmonary disease (COPD). Sometimes, these complications can require emergency medical care and may be life threatening.  CAUSES Almost all URIs are caused by viruses. A virus is a type of germ and can spread from one person to another.  RISKS FACTORS You may be at risk for a URI if:   You smoke.   You have chronic heart or lung disease.  You have a weakened defense (immune) system.   You are very young or very old.   You have nasal allergies or asthma.  You work in crowded or poorly ventilated areas.  You work in health care facilities or schools. SIGNS AND SYMPTOMS  Symptoms typically develop 2-3 days after you come in contact with a cold virus. Most viral URIs last 7-10 days. However, viral URIs from the influenza virus (flu virus) can last 14-18 days and are typically more severe. Symptoms may include:   Runny or stuffy (congested) nose.   Sneezing.   Cough.   Sore throat.   Headache.   Fatigue.   Fever.   Loss of appetite.   Pain in your forehead, behind  your eyes, and over your cheekbones (sinus pain).  Muscle aches.  DIAGNOSIS  Your health care provider may diagnose a URI by:  Physical exam.  Tests to check that your symptoms are not due to another condition such as:  Strep throat.  Sinusitis.  Pneumonia.  Asthma. TREATMENT  A URI goes away on its own with time. It cannot be cured with medicines, but medicines may be prescribed or recommended to relieve symptoms. Medicines may help:  Reduce your fever.  Reduce your cough.  Relieve nasal congestion. HOME CARE INSTRUCTIONS   Take medicines only as directed by your health care provider.   Gargle warm saltwater or take cough drops to comfort your throat as directed by your health care provider.  Use a warm mist humidifier or inhale steam from a shower to increase air moisture. This may make it easier to breathe.  Drink enough fluid to keep your urine clear or pale yellow.   Eat soups and other clear broths and maintain good nutrition.   Rest as needed.   Return to work when your temperature has returned to normal or as your health care provider advises. You may need to stay home longer to avoid infecting others. You can also use a face mask and careful hand washing to prevent spread of the virus.  Increase the usage of your inhaler if you have asthma.   Do not use any tobacco products, including cigarettes, chewing tobacco, or electronic  cigarettes. If you need help quitting, ask your health care provider. PREVENTION  The best way to protect yourself from getting a cold is to practice good hygiene.   Avoid oral or hand contact with people with cold symptoms.   Wash your hands often if contact occurs.  There is no clear evidence that vitamin C, vitamin E, echinacea, or exercise reduces the chance of developing a cold. However, it is always recommended to get plenty of rest, exercise, and practice good nutrition.  SEEK MEDICAL CARE IF:   You are getting worse  rather than better.   Your symptoms are not controlled by medicine.   You have chills.  You have worsening shortness of breath.  You have brown or red mucus.  You have yellow or brown nasal discharge.  You have pain in your face, especially when you bend forward.  You have a fever.  You have swollen neck glands.  You have pain while swallowing.  You have white areas in the back of your throat. SEEK IMMEDIATE MEDICAL CARE IF:   You have severe or persistent:  Headache.  Ear pain.  Sinus pain.  Chest pain.  You have chronic lung disease and any of the following:  Wheezing.  Prolonged cough.  Coughing up blood.  A change in your usual mucus.  You have a stiff neck.  You have changes in your:  Vision.  Hearing.  Thinking.  Mood. MAKE SURE YOU:   Understand these instructions.  Will watch your condition.  Will get help right away if you are not doing well or get worse.   This information is not intended to replace advice given to you by your health care provider. Make sure you discuss any questions you have with your health care provider.   Document Released: 03/04/2001 Document Revised: 01/23/2015 Document Reviewed: 12/14/2013 Elsevier Interactive Patient Education [employer].

## 2015-10-05 ENCOUNTER — Telehealth: Payer: Self-pay | Admitting: Nurse Practitioner

## 2016-02-05 ENCOUNTER — Encounter: Payer: Self-pay | Admitting: Family Medicine

## 2016-02-05 ENCOUNTER — Ambulatory Visit (INDEPENDENT_AMBULATORY_CARE_PROVIDER_SITE_OTHER): Payer: Commercial Managed Care - HMO | Admitting: Family Medicine

## 2016-02-05 VITALS — BP 159/68 | HR 69 | Temp 97.7°F | Ht 68.5 in | Wt 209.6 lb

## 2016-02-05 DIAGNOSIS — W57XXXA Bitten or stung by nonvenomous insect and other nonvenomous arthropods, initial encounter: Secondary | ICD-10-CM

## 2016-02-05 DIAGNOSIS — S30860A Insect bite (nonvenomous) of lower back and pelvis, initial encounter: Secondary | ICD-10-CM

## 2016-02-05 MED ORDER — DOXYCYCLINE HYCLATE 100 MG PO CAPS: 100.0000 mg | ORAL_CAPSULE | Freq: Two times a day (BID) | ORAL | Status: DC

## 2016-02-05 NOTE — Progress Notes (Signed)
Subjective:  Patient ID: Urbano HeirBobby L Lavalle, male    DOB: 02-Feb-1930  Age: 82 y.o. MRN: [id number]  CC: Insect Bite   HPI Urbano HeirBobby L Profeta presents for pulled tick off right buttocks about a week ago. Now red, swollen and sore. Denies fever, chills and sweats. No rash. No lassitude. Seems stable, but not improving. States swollen area about the size of a quarter.   History Reita ClicheBobby has a past medical history of Prostate enlargement; Hypertension; Hyperlipidemia; Obesity; and Bowel obstruction (HCC) (2015).   He has past surgical history that includes Appendectomy; Cataract extraction, bilateral (03/23/2015); and Eye surgery.   His family history includes Heart disease in his mother; Stroke in his father.He reports that he quit smoking about 41 years ago. His smoking use included Cigarettes. He started smoking about 68 years ago. He smoked 1.00 pack per day. He does not have any smokeless tobacco history on file. He reports that he does not drink alcohol or use illicit drugs.    ROS Review of Systems  Constitutional: Negative for fever, chills and diaphoresis.  HENT: Negative for rhinorrhea and sore throat.   Respiratory: Negative for cough and shortness of breath.   Cardiovascular: Negative for chest pain.  Gastrointestinal: Negative for abdominal pain.  Musculoskeletal: Negative for myalgias and arthralgias.  Skin: Negative for rash.  Neurological: Negative for weakness and headaches.    Objective:  BP 159/68 mmHg  Pulse 69  Temp(Src) 97.7 F (36.5 C) (Oral)  Ht 5' 8.5" (1.74 m)  Wt 209 lb 9.6 oz (95.074 kg)  BMI 31.40 kg/m2  SpO2 96%  BP Readings from Last 3 Encounters:  02/05/16 159/68  07/27/15 115/73  05/25/15 148/82    Wt Readings from Last 3 Encounters:  02/05/16 209 lb 9.6 oz (95.074 kg)  07/27/15 198 lb 3.2 oz (89.903 kg)  05/25/15 199 lb 6.4 oz (90.447 kg)     Physical Exam  Constitutional: He is oriented to person, place, and time. He appears well-developed and  well-nourished.  HENT:  Head: Normocephalic and atraumatic.  Mouth/Throat: No posterior oropharyngeal erythema.  Neck: Normal range of motion. Neck supple.  Cardiovascular: Normal rate and regular rhythm.   No murmur heard. Pulmonary/Chest: Breath sounds normal. No respiratory distress.  Lymphadenopathy:    He has no cervical adenopathy.  Neurological: He is alert and oriented to person, place, and time.  Skin: Skin is warm and dry. There is erythema (indurated, 3 cm to right of midline on right buttocks cheek. No head.).  Vitals reviewed.    Lab Results  Component Value Date   WBC 6.1 10/10/2013   HGB 12.0* 10/10/2013   HCT 35.9* 10/10/2013   PLT 449* 10/10/2013   GLUCOSE 116* 05/25/2015   CHOL 157 05/25/2015   TRIG 206* 05/25/2015   HDL 41 05/25/2015   LDLCALC 75 05/25/2015   ALT 16 10/10/2013   AST 18 10/10/2013   NA 140 05/25/2015   K 4.5 05/25/2015   CL 99 05/25/2015   CREATININE 1.41* 05/25/2015   BUN 22 05/25/2015   CO2 24 05/25/2015   TSH 3.880 10/10/2013   INR 1.10 09/22/2013   HGBA1C 6.0 05/25/2015    [place] Venous Img Lower Unilateral Right  06/04/2015  CLINICAL DATA:  Right leg pain and swelling x8 months EXAM: RIGHT LOWER EXTREMITY VENOUS DOPPLER ULTRASOUND TECHNIQUE: Gray-scale sonography with compression, as well as color and duplex ultrasound, were performed to evaluate the deep venous system from the level of the common femoral vein through  the popliteal and proximal calf veins. COMPARISON:  None FINDINGS: Normal compressibility of the common femoral, superficial femoral, and popliteal veins, as well as the proximal calf veins. No filling defects to suggest DVT on grayscale or color Doppler imaging. Doppler waveforms show normal direction of venous flow, normal respiratory phasicity and response to augmentation. Mild subcutaneous edema in the calf. Survey views of the contralateral common femoral vein are unremarkable. IMPRESSION: 1. No evidence of lower  extremity deep vein thrombosis, RIGHT. Electronically Signed   By: Corlis Leak M.D.   On: 06/04/2015 10:35    Assessment & Plan:   Bora was seen today for insect bite.  Diagnoses and all orders for this visit:  Tick bite of buttock, initial encounter  Other orders -     doxycycline (VIBRAMYCIN) 100 MG capsule; Take 1 capsule (100 mg total) by mouth 2 (two) times daily.     I am having Mr. Waldrop start on doxycycline. I am also having him maintain his hydrochlorothiazide, aspirin, pravastatin, lisinopril, HYDROcodone-acetaminophen, mometasone, and benzonatate.  Meds ordered this encounter  Medications  . doxycycline (VIBRAMYCIN) 100 MG capsule    Sig: Take 1 capsule (100 mg total) by mouth 2 (two) times daily.    Dispense:  20 capsule    Refill:  0     Follow-up: No Follow-up on file.  Mechele Claude, M.D.

## 2016-02-05 NOTE — Patient Instructions (Signed)
Use doxycycline twice daily until gone - 10 days. Take on an empty stomach, eat 45 min later to help with nausea.  Watch out for rash or fever. If they occur, follow up right away.

## 2016-04-25 ENCOUNTER — Ambulatory Visit (INDEPENDENT_AMBULATORY_CARE_PROVIDER_SITE_OTHER): Payer: Commercial Managed Care - HMO | Admitting: Nurse Practitioner

## 2016-04-25 VITALS — BP 152/78 | HR 62 | Temp 97.4°F | Ht 68.0 in | Wt 204.0 lb

## 2016-04-25 DIAGNOSIS — M25561 Pain in right knee: Secondary | ICD-10-CM | POA: Diagnosis not present

## 2016-04-25 MED ORDER — METHYLPREDNISOLONE ACETATE 80 MG/ML IJ SUSP
80.0000 mg | Freq: Once | INTRAMUSCULAR | Status: AC
  Administered 2016-04-25: 80 mg via INTRAMUSCULAR

## 2016-04-25 NOTE — Progress Notes (Signed)
   Subjective:    Patient ID: Miguel Hardy, male    DOB: 1930/08/17, 82 y.o.   MRN: [id number]  HPI Patient comes in today c/o right kne pain- he has been diagnosed with osteoarthritis- he is scheduled for total knee replacement September 11th. He does not know how long he can stand pain- kept him up all night last night. He has ultram at home put that is not completely resolving the pain.    Review of Systems  Constitutional: Negative.   HENT: Negative.   Respiratory: Negative.   Cardiovascular: Negative.   Genitourinary: Negative.   Neurological: Negative.   Psychiatric/Behavioral: Negative.   All other systems reviewed and are negative.      Objective:   Physical Exam  Constitutional: He is oriented to person, place, and time. He appears well-developed and well-nourished. No distress.  Cardiovascular: Normal rate, regular rhythm and normal heart sounds.   Pulmonary/Chest: Effort normal and breath sounds normal.  Musculoskeletal:  Mild right knee effusion with crepitus on flexion and extension. Pain on active ROM but not passive.  Neurological: He is alert and oriented to person, place, and time. He has normal reflexes.  Skin: Skin is warm and dry.  Psychiatric: He has a normal mood and affect. His behavior is normal. Judgment and thought content normal.    BP (!) 152/78 (BP Location: Left Arm, Cuff Size: Normal)   Pulse 62   Temp 97.4 F (36.3 C) (Oral)   Ht 5\' 8"  (1.727 m)   Wt 204 lb (92.5 kg)   BMI 31.02 kg/m        Assessment & Plan:   1. Right knee pain    Meds ordered this encounter  Medications  . aspirin 81 MG tablet    Sig: Take 81 mg by mouth daily.  . Omega-3 Fatty Acids (FISH OIL) 1000 MG CPDR    Sig: Take by mouth.  . hydroxypropyl methylcellulose / hypromellose (ISOPTO TEARS / GONIOVISC) 2.5 % ophthalmic solution    Sig: 1 drop.  . methylPREDNISolone acetate (DEPO-MEDROL) injection 80 mg   Elevate leg when sitting Moist heat Keep  appointment at [facility] for TKR Ultram as needed RTO prn  Mary-Margaret Daphine Deutscher, FNP

## 2016-04-25 NOTE — Patient Instructions (Signed)

## 2016-05-19 ENCOUNTER — Ambulatory Visit (INDEPENDENT_AMBULATORY_CARE_PROVIDER_SITE_OTHER): Payer: Commercial Managed Care - HMO | Admitting: Family Medicine

## 2016-05-19 ENCOUNTER — Encounter: Payer: Self-pay | Admitting: Family Medicine

## 2016-05-19 VITALS — BP 137/74 | HR 61 | Temp 97.2°F | Ht 68.0 in | Wt 200.2 lb

## 2016-05-19 DIAGNOSIS — M179 Osteoarthritis of knee, unspecified: Secondary | ICD-10-CM

## 2016-05-19 DIAGNOSIS — I10 Essential (primary) hypertension: Secondary | ICD-10-CM | POA: Diagnosis not present

## 2016-05-19 DIAGNOSIS — M1711 Unilateral primary osteoarthritis, right knee: Secondary | ICD-10-CM

## 2016-05-19 NOTE — Progress Notes (Signed)
   HPI  Patient presents today here with right knee arthritis.  Patient states that his pain currently is reasonably well managed currently with tramadol, rest, and topical pain ointment.  Patient has a scheduled surgery on September 11. He had a recent injection of the right knee for unbearable pain. This has resolved and improved.  He comes in requesting referral for home health physical therapy after his knee surgery.   PMH: Smoking status noted ROS: Per HPI  Objective: BP 137/74   Pulse 61   Temp 97.2 F (36.2 C) (Oral)   Ht 5\' 8"  (1.727 m)   Wt 200 lb 3.2 oz (90.8 kg)   BMI 30.44 kg/m  Gen: NAD, alert, cooperative with exam HEENT: NCAT CV: RRR, good S1/S2, no murmur Resp: CTABL, no wheezes, non-labored Ext: No edema, warm Neuro: Alert and oriented, No gross deficits  MSK: R knee without erythema, bruising, or gross deformity Positive crepitus of the right knee,    Assessment and plan:  # Osteoarthritis of the right knee End-stage Discussed with patient usual practices around knee replacement. I'm confident that his orthopedic surgeon will arrange home health physical therapy after discharge from the hospital. We discussed his pain which is reasonably well controlled at this time. Return to clinic as needed, originally misunderstood and wrote him a referral for outpatient physical therapy, he states that he will get [facility] [place].   Hypertension well controlled on lisinopril and HCTZ   Murtis SinkSam Bradshaw, MD [facility] 05/19/2016, 1:11 PM

## 2016-05-19 NOTE — Patient Instructions (Signed)
Great to meet you!  I have written you referral, feel free to call them to schedule an appointment or to wait for their call.

## 2016-06-26 ENCOUNTER — Ambulatory Visit (INDEPENDENT_AMBULATORY_CARE_PROVIDER_SITE_OTHER): Payer: Commercial Managed Care - HMO | Admitting: Family Medicine

## 2016-06-26 ENCOUNTER — Encounter: Payer: Self-pay | Admitting: Family Medicine

## 2016-06-26 VITALS — BP 130/74 | HR 93 | Temp 98.7°F | Ht 68.0 in | Wt 198.1 lb

## 2016-06-26 DIAGNOSIS — R3912 Poor urinary stream: Secondary | ICD-10-CM

## 2016-06-26 DIAGNOSIS — N401 Enlarged prostate with lower urinary tract symptoms: Secondary | ICD-10-CM | POA: Diagnosis not present

## 2016-06-26 DIAGNOSIS — R300 Dysuria: Secondary | ICD-10-CM

## 2016-06-26 MED ORDER — CEPHALEXIN 500 MG PO CAPS: 500.0000 mg | ORAL_CAPSULE | Freq: Two times a day (BID) | ORAL | 0 refills | Status: DC

## 2016-06-26 MED ORDER — TAMSULOSIN HCL 0.4 MG PO CAPS: 0.4000 mg | ORAL_CAPSULE | Freq: Every day | ORAL | 1 refills | Status: DC

## 2016-06-26 NOTE — Progress Notes (Signed)
BP 130/74   Pulse 93   Temp 98.7 F (37.1 C) (Oral)   Ht 5\' 8"  (1.727 m)   Wt 198 lb 2 oz (89.9 kg)   BMI 30.12 kg/m    Subjective:    Patient ID: Miguel Hardy, male    DOB: [birth year], 82 y.o.   MRN: [id number]  HPI: Miguel Hardy is a 82 y.o. male presenting on 06/26/2016 for Urinary Tract Infection (burning with urination, frequent urination; patient unable to take flomax caused hypotension)   HPI Dysuria and urinary hesitancy Patient has been having burning with urination and frequent urination and he also has difficulty starting his stream and has gotten urinary tract infections because of his enlarged prostate and that he can't take the medication for the enlarged prostate but because it causes hypotension. He is currently on 2 blood pressure pills and could not take the Flomax on top of it. He denies any flank pain or fevers or chills.  Relevant past medical, surgical, family and social history reviewed and updated as indicated. Interim medical history since our last visit reviewed. Allergies and medications reviewed and updated.  Review of Systems  Constitutional: Negative for chills and fever.  Eyes: Negative for discharge.  Respiratory: Negative for shortness of breath and wheezing.   Cardiovascular: Negative for chest pain and leg swelling.  Genitourinary: Positive for dysuria, frequency and urgency. Negative for flank pain and hematuria.  Musculoskeletal: Negative for back pain and gait problem.  Skin: Negative for rash.  All other systems reviewed and are negative.   Per HPI unless specifically indicated above     Objective:    BP 130/74   Pulse 93   Temp 98.7 F (37.1 C) (Oral)   Ht 5\' 8"  (1.727 m)   Wt 198 lb 2 oz (89.9 kg)   BMI 30.12 kg/m   Wt Readings from Last 3 Encounters:  06/26/16 198 lb 2 oz (89.9 kg)  05/19/16 200 lb 3.2 oz (90.8 kg)  04/25/16 204 lb (92.5 kg)    Physical Exam  Constitutional: He is oriented to person, place, and time.  He appears well-developed and well-nourished. No distress.  Eyes: Conjunctivae are normal. Right eye exhibits no discharge. Left eye exhibits no discharge. No scleral icterus.  Cardiovascular: Normal rate, regular rhythm, normal heart sounds and intact distal pulses.   No murmur heard. Pulmonary/Chest: Effort normal and breath sounds normal. No respiratory distress. He has no wheezes.  Abdominal: Soft. Bowel sounds are normal. He exhibits no distension. There is tenderness (Mild suprapubic abdominal pain, no rebound or CVA tenderness). There is no rebound.  Musculoskeletal: Normal range of motion. He exhibits no edema.  Neurological: He is alert and oriented to person, place, and time. Coordination normal.  Skin: Skin is warm and dry. No rash noted. He is not diaphoretic.  Psychiatric: He has a normal mood and affect. His behavior is normal.  Nursing note and vitals reviewed.  Urinalysis: 6-10 WBCs, 0-2 RBCs, 0-10 epithelial cells, mucus present, few bacteria    Assessment & Plan:   Problem List Items Addressed This Visit    None    Visit Diagnoses    Dysuria    -  Primary   Patient has dysuria and urinary retention since his operation for his knee 3 weeks ago.   Relevant Medications   cephALEXin (KEFLEX) 500 MG capsule   Other Relevant Orders   Urinalysis, Complete (Completed)   Benign prostatic hyperplasia with weak urinary stream  Patient had tried Flomax once previously while he was on his blood pressure medications and became hypotensive, I recommended trying it but stopping the HCTZ   Relevant Medications   tamsulosin (FLOMAX) 0.4 MG CAPS capsule       Follow up plan: Return if symptoms worsen or fail to improve.  Counseling provided for all of the vaccine components Orders Placed This Encounter  Procedures  . Urinalysis, Complete    Arville Care, MD [facility] 06/26/2016, 4:52 PM

## 2016-06-27 LAB — URINALYSIS, COMPLETE
BILIRUBIN UA: NEGATIVE
GLUCOSE, UA: NEGATIVE
KETONES UA: NEGATIVE
Nitrite, UA: NEGATIVE
PROTEIN UA: NEGATIVE
RBC, UA: NEGATIVE
SPEC GRAV UA: 1.015 (ref 1.005–1.030)
Urobilinogen, Ur: 0.2 mg/dL (ref 0.2–1.0)
pH, UA: 7 (ref 5.0–7.5)

## 2016-06-27 LAB — MICROSCOPIC EXAMINATION

## 2016-07-18 ENCOUNTER — Ambulatory Visit: Payer: Commercial Managed Care - HMO | Admitting: Family Medicine

## 2016-09-23 ENCOUNTER — Ambulatory Visit (INDEPENDENT_AMBULATORY_CARE_PROVIDER_SITE_OTHER): Payer: Medicare HMO | Admitting: Family Medicine

## 2016-09-23 ENCOUNTER — Encounter: Payer: Self-pay | Admitting: Family Medicine

## 2016-09-23 VITALS — BP 177/63 | HR 54 | Temp 97.8°F | Ht 68.0 in | Wt 213.0 lb

## 2016-09-23 DIAGNOSIS — J069 Acute upper respiratory infection, unspecified: Secondary | ICD-10-CM

## 2016-09-23 DIAGNOSIS — J209 Acute bronchitis, unspecified: Secondary | ICD-10-CM

## 2016-09-23 MED ORDER — AZITHROMYCIN 250 MG PO TABS
ORAL_TABLET | ORAL | 0 refills | Status: DC
Start: 2016-09-23 — End: 2018-06-28

## 2016-09-23 NOTE — Progress Notes (Signed)
Subjective:    Patient ID: Miguel Hardy, male    DOB: 1930-02-22, 83 y.o.   MRN: [id number]  HPI Patient here today for cough and congestion that started about 1 week ago. The patient's blood pressure is elevated today. On the previous visit it was well within normal limits. The congestion and the horse and that he is complaining of have been going on for about a week. He does not describe any fever. The patient says he has a history of an irregular heartbeat that was noted by the [place]. He denies any problems with his stomach including nausea vomiting diarrhea or problems passing his water. He is not having any chest pain or pressure just the cough and congestion.   Patient Active Problem List   Diagnosis Date Noted  . Pedal edema 10/04/2013  . Ulcers of both lower legs, limited to breakdown of skin (HCC) 10/04/2013  . Obesity   . Small bowel ischemia (HCC) 09/22/2013  . SBO (small bowel obstruction) 09/21/2013  . ARF (acute renal failure) (HCC) 09/21/2013  . HTN (hypertension), benign 09/21/2013  . Hypercholesterolemia 09/21/2013   Outpatient Encounter Prescriptions as of 09/23/2016  Medication Sig  . aspirin 81 MG tablet Take 81 mg by mouth daily.  . hydroxypropyl methylcellulose / hypromellose (ISOPTO TEARS / GONIOVISC) 2.5 % ophthalmic solution 1 drop.  Marland Kitchen lisinopril (PRINIVIL,ZESTRIL) 40 MG tablet Take 40 mg by mouth daily. Take 1/2 tablet daily  . mometasone (NASONEX) 50 MCG/ACT nasal spray Place 2 sprays into the nose 2 (two) times daily.  . Omega-3 Fatty Acids (FISH OIL) 1000 MG CPDR Take by mouth.  . pravastatin (PRAVACHOL) 20 MG tablet Take 20 mg by mouth daily.  . [DISCONTINUED] tamsulosin (FLOMAX) 0.4 MG CAPS capsule Take 1 capsule (0.4 mg total) by mouth daily.  . [DISCONTINUED] cephALEXin (KEFLEX) 500 MG capsule Take 1 capsule (500 mg total) by mouth 2 (two) times daily.   No facility-administered encounter medications on file as of 09/23/2016.       Review of Systems    Constitutional: Negative.  Negative for fever.  HENT: Positive for congestion and voice change.   Eyes: Negative.   Respiratory: Positive for cough.   Cardiovascular: Negative.   Gastrointestinal: Negative.   Endocrine: Negative.   Genitourinary: Negative.   Musculoskeletal: Negative.   Skin: Negative.   Allergic/Immunologic: Negative.   Neurological: Negative.   Hematological: Negative.   Psychiatric/Behavioral: Negative.        Objective:   Physical Exam  Constitutional: He is oriented to person, place, and time. He appears well-developed and well-nourished. No distress.  HENT:  Head: Normocephalic and atraumatic.  Right Ear: External ear normal.  Left Ear: External ear normal.  Mouth/Throat: Oropharynx is clear and moist. No oropharyngeal exudate.  Some nasal congestion bilaterally  Eyes: Conjunctivae and EOM are normal. Pupils are equal, round, and reactive to light. Right eye exhibits no discharge. Left eye exhibits no discharge. No scleral icterus.  Neck: Normal range of motion. Neck supple. No thyromegaly present.  No anterior cervical adenopathy  Cardiovascular: Normal rate and normal heart sounds.   No murmur heard. 72/m with occasional PVC.  Pulmonary/Chest: Effort normal and breath sounds normal. No respiratory distress. He has no wheezes. He has no rales.  The patient has a dry cough. There are no wheezes or rales.  Abdominal: Soft. Bowel sounds are normal. He exhibits no mass. There is no tenderness. There is no rebound and no guarding.  Musculoskeletal: Normal range of  motion. He exhibits no edema.  Lymphadenopathy:    He has no cervical adenopathy.  Neurological: He is alert and oriented to person, place, and time. No cranial nerve deficit.  Skin: Skin is warm and dry. No rash noted.  Psychiatric: He has a normal mood and affect. His behavior is normal. Judgment and thought content normal.  Nursing note and vitals reviewed.  BP (!) 151/70 (BP Location: Left  Arm)   Pulse (!) 57   Temp 97.8 F (36.6 C) (Oral)   Ht 5\' 8"  (1.727 m)   Wt 213 lb (96.6 kg)   BMI 32.39 kg/m         Assessment & Plan:  1. Upper respiratory tract infection, unspecified type -Drink plenty of fluids and stay well hydrated and take Mucinex for cough and congestion, plain, blue and white in color one twice daily with a large glass of water -Use nasal saline frequently in each nostril during the day -If possible use a cool mist humidifier in the bedroom at night  2. Acute bronchitis, unspecified organism -Take Z-Pak as directed  Meds ordered this encounter  Medications  . azithromycin (ZITHROMAX) 250 MG tablet    Sig: As directed    Dispense:  6 tablet    Refill:  0   Patient Instructions  Drink plenty of fluids and stay well hydrated Use a cool mist humidifier if you have 1 Take Mucinex maximum strength blue and white in color, plain and take one twice daily with a large glass of water Use nasal saline frequently in each nostril during the day Take antibiotic as directed If not doing better and a week or so please call us back for recheck  Nyra Capeson W. Esta Carmon MD

## 2016-09-23 NOTE — Patient Instructions (Signed)
Drink plenty of fluids and stay well hydrated Use a cool mist humidifier if you have 1 Take Mucinex maximum strength blue and white in color, plain and take one twice daily with a large glass of water Use nasal saline frequently in each nostril during the day Take antibiotic as directed If not doing better and a week or so please call us back for recheck

## 2016-10-21 ENCOUNTER — Other Ambulatory Visit: Payer: Self-pay | Admitting: Family Medicine

## 2016-11-07 ENCOUNTER — Ambulatory Visit: Payer: Medicare HMO | Admitting: Family Medicine

## 2017-10-27 ENCOUNTER — Ambulatory Visit: Payer: Medicare HMO

## 2017-12-11 ENCOUNTER — Ambulatory Visit: Payer: Medicare HMO | Admitting: Family Medicine

## 2018-06-10 ENCOUNTER — Ambulatory Visit: Payer: Medicare HMO | Admitting: Family Medicine

## 2018-06-17 ENCOUNTER — Encounter: Payer: Self-pay | Admitting: Family Medicine

## 2018-06-28 ENCOUNTER — Ambulatory Visit (INDEPENDENT_AMBULATORY_CARE_PROVIDER_SITE_OTHER): Payer: Medicare HMO | Admitting: Family Medicine

## 2018-06-28 ENCOUNTER — Encounter: Payer: Self-pay | Admitting: Family Medicine

## 2018-06-28 VITALS — BP 133/74 | HR 60 | Temp 96.9°F | Ht 68.0 in | Wt 206.2 lb

## 2018-06-28 DIAGNOSIS — I10 Essential (primary) hypertension: Secondary | ICD-10-CM | POA: Diagnosis not present

## 2018-06-28 DIAGNOSIS — E669 Obesity, unspecified: Secondary | ICD-10-CM | POA: Diagnosis not present

## 2018-06-28 DIAGNOSIS — E7800 Pure hypercholesterolemia, unspecified: Secondary | ICD-10-CM

## 2018-06-28 DIAGNOSIS — Z6831 Body mass index (BMI) 31.0-31.9, adult: Secondary | ICD-10-CM

## 2018-06-28 NOTE — Progress Notes (Signed)
BP 133/74   Pulse 60   Temp (!) 96.9 F (36.1 C) (Oral)   Ht 5\' 8"  (1.727 m)   Wt 206 lb 3.2 oz (93.5 kg)   BMI 31.35 kg/m    Subjective:    Patient ID: Miguel Hardy, male    DOB: [birth year], 84 y.o.   MRN: [id number]  HPI: Miguel Hardy is a 84 y.o. male presenting on 06/28/2018 for Hypertension (check up of chronic medical conditions) and Hyperlipidemia   HPI Hypertension Patient is currently on lisinopril, and their blood pressure today is 133/74. Patient denies any lightheadedness or dizziness. Patient denies headaches, blurred vision, chest pains, shortness of breath, or weakness. Denies any side effects from medication and is content with current medication.   Hyperlipidemia Patient is coming in for recheck of his hyperlipidemia. The patient is currently taking fish oil and pravastatin. They deny any issues with myalgias or history of liver damage from it. They deny any focal numbness or weakness or chest pain.   Relevant past medical, surgical, family and social history reviewed and updated as indicated. Interim medical history since our last visit reviewed. Allergies and medications reviewed and updated.  Review of Systems  Constitutional: Negative for chills and fever.  Eyes: Negative for visual disturbance.  Respiratory: Negative for shortness of breath and wheezing.   Cardiovascular: Negative for chest pain and leg swelling.  Musculoskeletal: Negative for back pain and gait problem.  Skin: Negative for rash.  Neurological: Negative for dizziness, weakness, light-headedness, numbness and headaches.  All other systems reviewed and are negative.   Per HPI unless specifically indicated above   Allergies as of 06/28/2018      Reactions   Flomax [tamsulosin Hcl]       Medication List        Accurate as of 06/28/18  2:06 PM. Always use your most recent med list.          aspirin 81 MG tablet Take 81 mg by mouth daily.   finasteride 5 MG tablet Commonly  known as:  PROSCAR Take 5 mg by mouth daily.   Fish Oil 1000 MG Cpdr Take by mouth.   hydrochlorothiazide 25 MG tablet Commonly known as:  HYDRODIURIL Take 25 mg by mouth daily.   hydroxypropyl methylcellulose / hypromellose 2.5 % ophthalmic solution Commonly known as:  ISOPTO TEARS / GONIOVISC 1 drop.   lisinopril 40 MG tablet Commonly known as:  PRINIVIL,ZESTRIL Take 40 mg by mouth daily. Take 1/2 tablet daily   mometasone 50 MCG/ACT nasal spray Commonly known as:  NASONEX USE TWO SPRAY(S) IN EACH NOSTRIL TWICE DAILY   pravastatin 20 MG tablet Commonly known as:  PRAVACHOL Take 20 mg by mouth daily.          Objective:    BP 133/74   Pulse 60   Temp (!) 96.9 F (36.1 C) (Oral)   Ht 5\' 8"  (1.727 m)   Wt 206 lb 3.2 oz (93.5 kg)   BMI 31.35 kg/m   Wt Readings from Last 3 Encounters:  06/28/18 206 lb 3.2 oz (93.5 kg)  09/23/16 213 lb (96.6 kg)  06/26/16 198 lb 2 oz (89.9 kg)    Physical Exam  Constitutional: He is oriented to person, place, and time. He appears well-developed and well-nourished. No distress.  Eyes: Conjunctivae are normal. No scleral icterus.  Neck: Neck supple. No thyromegaly present.  Cardiovascular: Normal rate, regular rhythm, normal heart sounds and intact distal pulses.  No murmur heard.  Pulmonary/Chest: Effort normal and breath sounds normal. No respiratory distress. He has no wheezes.  Musculoskeletal: Normal range of motion. He exhibits no edema.  Lymphadenopathy:    He has no cervical adenopathy.  Neurological: He is alert and oriented to person, place, and time. Coordination normal.  Skin: Skin is warm and dry. No rash noted. He is not diaphoretic.  Psychiatric: He has a normal mood and affect. His behavior is normal.  Nursing note and vitals reviewed.       Assessment & Plan:   Problem List Items Addressed This Visit      Cardiovascular and Mediastinum   HTN (hypertension), benign - Primary (Chronic)   Relevant  Medications   hydrochlorothiazide (HYDRODIURIL) 25 MG tablet     Other   Hypercholesterolemia (Chronic)   Relevant Medications   hydrochlorothiazide (HYDRODIURIL) 25 MG tablet   Obesity      She had blood work at [place], recommended for him to bring that blood work by for [place] and we will get the results. Follow up plan: Return in about 1 year (around 06/29/2019), or if symptoms worsen or fail to improve, for [place] sees [place], return in 1 year for [place].  Counseling provided for all of the vaccine components No orders of the defined types were placed in this encounter.   Arville Care, MD [facility] 06/28/2018, 2:06 PM

## 2018-10-11 DIAGNOSIS — Z029 Encounter for administrative examinations, unspecified: Secondary | ICD-10-CM

## 2019-02-10 ENCOUNTER — Encounter: Payer: Self-pay | Admitting: *Deleted

## 2019-02-10 ENCOUNTER — Ambulatory Visit: Payer: Medicare HMO

## 2019-02-10 ENCOUNTER — Other Ambulatory Visit: Payer: Self-pay

## 2019-03-12 DIAGNOSIS — R079 Chest pain, unspecified: Secondary | ICD-10-CM | POA: Diagnosis not present

## 2019-03-22 ENCOUNTER — Encounter (HOSPITAL_COMMUNITY): Payer: Self-pay | Admitting: *Deleted

## 2019-03-22 ENCOUNTER — Emergency Department (HOSPITAL_COMMUNITY): Payer: No Typology Code available for payment source

## 2019-03-22 ENCOUNTER — Other Ambulatory Visit: Payer: Self-pay

## 2019-03-22 ENCOUNTER — Emergency Department (HOSPITAL_COMMUNITY)
Admission: EM | Admit: 2019-03-22 | Discharge: 2019-03-22 | Disposition: A | Discharge status: Home / Self Care | Payer: No Typology Code available for payment source | Attending: Emergency Medicine | Admitting: Emergency Medicine

## 2019-03-22 DIAGNOSIS — R1030 Lower abdominal pain, unspecified: Secondary | ICD-10-CM | POA: Diagnosis present

## 2019-03-22 DIAGNOSIS — Z7982 Long term (current) use of aspirin: Secondary | ICD-10-CM | POA: Diagnosis not present

## 2019-03-22 DIAGNOSIS — I10 Essential (primary) hypertension: Secondary | ICD-10-CM | POA: Diagnosis not present

## 2019-03-22 DIAGNOSIS — Z87891 Personal history of nicotine dependence: Secondary | ICD-10-CM | POA: Insufficient documentation

## 2019-03-22 DIAGNOSIS — K5792 Diverticulitis of intestine, part unspecified, without perforation or abscess without bleeding: Secondary | ICD-10-CM | POA: Diagnosis not present

## 2019-03-22 DIAGNOSIS — R109 Unspecified abdominal pain: Secondary | ICD-10-CM | POA: Diagnosis not present

## 2019-03-22 DIAGNOSIS — Z79899 Other long term (current) drug therapy: Secondary | ICD-10-CM | POA: Diagnosis not present

## 2019-03-22 DIAGNOSIS — N400 Enlarged prostate without lower urinary tract symptoms: Secondary | ICD-10-CM | POA: Diagnosis not present

## 2019-03-22 DIAGNOSIS — R3129 Other microscopic hematuria: Secondary | ICD-10-CM | POA: Diagnosis not present

## 2019-03-22 HISTORY — DX: Unspecified osteoarthritis, unspecified site: M19.90

## 2019-03-22 LAB — URINALYSIS, ROUTINE W REFLEX MICROSCOPIC
Bacteria, UA: NONE SEEN
Bilirubin Urine: NEGATIVE
Glucose, UA: NEGATIVE mg/dL
Ketones, ur: NEGATIVE mg/dL
Leukocytes,Ua: NEGATIVE
Nitrite: NEGATIVE
Protein, ur: NEGATIVE mg/dL
Specific Gravity, Urine: 1.018 (ref 1.005–1.030)
pH: 7 (ref 5.0–8.0)

## 2019-03-22 LAB — CBC WITH DIFFERENTIAL/PLATELET
Abs Immature Granulocytes: 0.01 10*3/uL (ref 0.00–0.07)
Basophils Absolute: 0.1 10*3/uL (ref 0.0–0.1)
Basophils Relative: 1 %
Eosinophils Absolute: 0.3 10*3/uL (ref 0.0–0.5)
Eosinophils Relative: 5 %
HCT: 40.6 % (ref 39.0–52.0)
Hemoglobin: 13.6 g/dL (ref 13.0–17.0)
Immature Granulocytes: 0 %
Lymphocytes Relative: 17 %
Lymphs Abs: 0.9 10*3/uL (ref 0.7–4.0)
MCH: 30.5 pg (ref 26.0–34.0)
MCHC: 33.5 g/dL (ref 30.0–36.0)
MCV: 91 fL (ref 80.0–100.0)
Monocytes Absolute: 0.5 10*3/uL (ref 0.1–1.0)
Monocytes Relative: 10 %
Neutro Abs: 3.7 10*3/uL (ref 1.7–7.7)
Neutrophils Relative %: 67 %
Platelets: 253 10*3/uL (ref 150–400)
RBC: 4.46 MIL/uL (ref 4.22–5.81)
RDW: 13.5 % (ref 11.5–15.5)
WBC: 5.5 10*3/uL (ref 4.0–10.5)
nRBC: 0 % (ref 0.0–0.2)

## 2019-03-22 LAB — COMPREHENSIVE METABOLIC PANEL
ALT: 18 U/L (ref 0–44)
AST: 19 U/L (ref 15–41)
Albumin: 4.2 g/dL (ref 3.5–5.0)
Alkaline Phosphatase: 52 U/L (ref 38–126)
Anion gap: 12 (ref 5–15)
BUN: 25 mg/dL — ABNORMAL HIGH (ref 8–23)
CO2: 26 mmol/L (ref 22–32)
Calcium: 9.6 mg/dL (ref 8.9–10.3)
Chloride: 101 mmol/L (ref 98–111)
Creatinine, Ser: 1.35 mg/dL — ABNORMAL HIGH (ref 0.61–1.24)
GFR calc Af Amer: 54 mL/min — ABNORMAL LOW (ref >60)
GFR calc non Af Amer: 47 mL/min — ABNORMAL LOW (ref >60)
Glucose, Bld: 131 mg/dL — ABNORMAL HIGH (ref 70–99)
Potassium: 3.9 mmol/L (ref 3.5–5.1)
Sodium: 139 mmol/L (ref 135–145)
Total Bilirubin: 0.9 mg/dL (ref 0.3–1.2)
Total Protein: 7.9 g/dL (ref 6.5–8.1)

## 2019-03-22 LAB — LIPASE, BLOOD: Lipase: 42 U/L (ref 11–51)

## 2019-03-22 MED ORDER — METRONIDAZOLE 500 MG PO TABS
500.0000 mg | ORAL_TABLET | Freq: Once | ORAL | Status: AC
  Administered 2019-03-22: 500 mg via ORAL
  Filled 2019-03-22: qty 1

## 2019-03-22 MED ORDER — METRONIDAZOLE 500 MG PO TABS: 500.0000 mg | ORAL_TABLET | Freq: Three times a day (TID) | ORAL | 0 refills | Status: DC

## 2019-03-22 MED ORDER — LISINOPRIL 10 MG PO TABS
20.0000 mg | ORAL_TABLET | Freq: Once | ORAL | Status: AC
  Administered 2019-03-22: 20 mg via ORAL
  Filled 2019-03-22: qty 2

## 2019-03-22 MED ORDER — CIPROFLOXACIN HCL 500 MG PO TABS: 500.0000 mg | ORAL_TABLET | Freq: Two times a day (BID) | ORAL | 0 refills | Status: DC

## 2019-03-22 MED ORDER — AMLODIPINE BESYLATE 5 MG PO TABS
2.5000 mg | ORAL_TABLET | Freq: Once | ORAL | Status: AC
  Administered 2019-03-22: 2.5 mg via ORAL
  Filled 2019-03-22: qty 1

## 2019-03-22 MED ORDER — IOHEXOL 300 MG/ML  SOLN
100.0000 mL | Freq: Once | INTRAMUSCULAR | Status: AC | PRN
  Administered 2019-03-22: 100 mL via INTRAVENOUS

## 2019-03-22 MED ORDER — CIPROFLOXACIN HCL 250 MG PO TABS
500.0000 mg | ORAL_TABLET | Freq: Once | ORAL | Status: AC
  Administered 2019-03-22: 500 mg via ORAL
  Filled 2019-03-22: qty 2

## 2019-03-22 NOTE — ED Notes (Signed)
Patient waiting for Radiology to bring CD of CT to patient before he leaves to be able to show his [place] provider.

## 2019-03-22 NOTE — ED Triage Notes (Signed)
Pt c/o lower abdominal pain that started Saturday along with 1-2 abnormal bowel movements daily. Pt also c/o feeling as if he needs to have a bowel movement, but cant. Denies n/v.

## 2019-03-22 NOTE — ED Notes (Signed)
34ml per bladder scan

## 2019-03-22 NOTE — Discharge Instructions (Signed)
Take the entire course of the antibiotics. Make sure you are drinking plenty of fluids and I recommend avoiding constipation, you may want to take a stool softener daily if needed.  As discussed,  there is a abnormality on your CT scan which is either part of your enlarged prostate, or can possibly be a tumor in your bladder.  You do not have a urinary infection, but there are a few red blood cells in your urine today. We recommend you talking to your urologist at the [facility] to discuss this as a cystoscopy procedure is recommended to better assess this finding.    You will need to get close follow up (may return here) if you develop fevers, nausea, vomiting or any worsening abdominal pain - these symptoms may indicate your intestinal infection is not responding appropriately to the antibiotics.

## 2019-03-23 NOTE — ED Provider Notes (Signed)
[facility] EMERGENCY DEPARTMENT Provider Note   CSN: [id number] Arrival date & time: 03/22/19  0944    History   Chief Complaint Chief Complaint  Patient presents with   Abdominal Pain    HPI Miguel Hardy is a 85 y.o. male with a history of HTN, hyperlipidemia, bph and prior history of small bowel obstruction presenting with concerns of bilateral lower abdominal pain over the past 2 days.  He is currently symptom free but describes intermittent cramping pain in the lower abdomen.  He denies fevers, chills, n/v and has had no rectal bleeding or diarrhea. He does endorse passing several smaller than normal stools which have been formed since yesterday but did not provide any relief.  He has not ate today over concern this would trigger pain, but denies any trigger of pain with eating yesterday.  He also woke today with difficulty urinating but this seems to have improved.  His symptoms remind him of the last episode of sbo.  Surgical history sig for appendectomy.  He has had no medications for his sx, has not taken any of his meds today.  Denies cp, sob, weakness. Denies abdominal distention.     The history is provided by the patient.    Past Medical History:  Diagnosis Date   Arthritis    Bowel obstruction (HCC) 2015   Cataract    Hyperlipidemia    Hypertension    Obesity    Prostate enlargement     Patient Active Problem List   Diagnosis Date Noted   Pedal edema 10/04/2013   Ulcers of both lower legs, limited to breakdown of skin (HCC) 10/04/2013   Obesity    Small bowel ischemia (HCC) 09/22/2013   SBO (small bowel obstruction) (HCC) 09/21/2013   ARF (acute renal failure) (HCC) 09/21/2013   HTN (hypertension), benign 09/21/2013   Hypercholesterolemia 09/21/2013   Regular astigmatism 07/13/2013   Senile nuclear sclerosis 07/01/2012   Hypermetropia 07/01/2012   Presbyopia 07/01/2012   PVD (posterior vitreous detachment) 07/01/2012    Past  Surgical History:  Procedure Laterality Date   APPENDECTOMY     CATARACT EXTRACTION, BILATERAL  03/23/2015   EYE SURGERY     REPLACEMENT TOTAL KNEE Right         Home Medications    Prior to Admission medications   Medication Sig Start Date End Date Taking? Authorizing Provider  amLODipine (NORVASC) 2.5 MG tablet Take 2.5 mg by mouth daily.   Yes [provider]  aspirin 81 MG tablet Take 81 mg by mouth daily.   Yes [provider]  finasteride (PROSCAR) 5 MG tablet Take 5 mg by mouth daily.   Yes [provider]  hydrochlorothiazide (HYDRODIURIL) 25 MG tablet Take 25 mg by mouth daily.   Yes [provider]  hydroxypropyl methylcellulose / hypromellose (ISOPTO TEARS / GONIOVISC) 2.5 % ophthalmic solution 1 drop.   Yes [provider]  lisinopril (PRINIVIL,ZESTRIL) 40 MG tablet Take 20 mg by mouth daily. Take 1/2 tablet daily    Yes [provider]  Omega-3 Fatty Acids (FISH OIL) 1000 MG CPDR Take by mouth.   Yes [provider]  pravastatin (PRAVACHOL) 20 MG tablet Take 20 mg by mouth daily.   Yes [provider]  ciprofloxacin (CIPRO) 500 MG tablet Take 1 tablet (500 mg total) by mouth every 12 (twelve) hours. 03/22/19   Burgess AmorIdol, Finnis Colee, PA-C  metroNIDAZOLE (FLAGYL) 500 MG tablet Take 1 tablet (500 mg total) by mouth 3 (three)  times daily. 03/22/19   Burgess AmorIdol, Vergie Zahm, PA-C  mometasone (NASONEX) 50 MCG/ACT nasal spray USE TWO SPRAY(S) IN EACH NOSTRIL TWICE DAILY Patient not taking: Reported on 03/22/2019 10/22/16   Dettinger, Elige RadonJoshua A, MD    Family History Family History  Problem Relation Age of Onset   Heart disease Mother    Stroke Father     Social History Social History   Tobacco Use   Smoking status: Former Smoker    Packs/day: 1.00    Types: Cigarettes    Start date: 02/08/1948    Quit date: 09/22/1974    Years since quitting: 44.5   Smokeless tobacco: Never Used  Substance Use Topics   Alcohol use:  No   Drug use: No     Allergies   Flomax [tamsulosin hcl]   Review of Systems Review of Systems  Constitutional: Negative for chills and fever.  HENT: Negative.   Eyes: Negative.   Respiratory: Negative for chest tightness and shortness of breath.   Cardiovascular: Negative for chest pain.  Gastrointestinal: Positive for abdominal pain. Negative for blood in stool, constipation, diarrhea, nausea, rectal pain and vomiting.  Genitourinary: Positive for difficulty urinating. Negative for frequency.  Musculoskeletal: Negative for arthralgias, joint swelling and neck pain.  Skin: Negative.  Negative for rash and wound.  Neurological: Negative for dizziness, weakness, light-headedness, numbness and headaches.  Psychiatric/Behavioral: Negative.      Physical Exam Updated Vital Signs BP (!) 189/69    Pulse 61    Temp 98.2 F (36.8 C) (Oral)    Resp 14    Ht 5' 8.5" (1.74 m)    Wt 91.2 kg    SpO2 100%    BMI 30.12 kg/m   Physical Exam Vitals signs and nursing note reviewed.  Constitutional:      Appearance: He is well-developed.  HENT:     Head: Normocephalic and atraumatic.  Eyes:     Conjunctiva/sclera: Conjunctivae normal.  Neck:     Musculoskeletal: Normal range of motion.  Cardiovascular:     Rate and Rhythm: Normal rate and regular rhythm.     Heart sounds: Normal heart sounds.  Pulmonary:     Effort: Pulmonary effort is normal.     Breath sounds: Normal breath sounds. No wheezing.  Abdominal:     General: Abdomen is protuberant. Bowel sounds are normal.     Palpations: Abdomen is soft.     Tenderness: There is no abdominal tenderness. There is no guarding or rebound.     Comments: Pt pain free at time of exam.  Musculoskeletal: Normal range of motion.  Skin:    General: Skin is warm and dry.  Neurological:     General: No focal deficit present.     Mental Status: He is alert.      ED Treatments / Results  Labs (all labs ordered are listed, but only  abnormal results are displayed) Labs Reviewed  URINALYSIS, ROUTINE W REFLEX MICROSCOPIC - Abnormal; Notable for the following components:      Result Value   Color, Urine COLORLESS (*)    Hgb urine dipstick SMALL (*)    All other components within normal limits  COMPREHENSIVE METABOLIC PANEL - Abnormal; Notable for the following components:   Glucose, Bld 131 (*)    BUN 25 (*)    Creatinine, Ser 1.35 (*)    GFR calc non Af Amer 47 (*)    GFR calc Af Amer 54 (*)    All other components within  normal limits  CBC WITH DIFFERENTIAL/PLATELET  LIPASE, BLOOD    EKG None  Radiology Ct Abdomen Pelvis W Contrast  Result Date: 03/22/2019 CLINICAL DATA:  Acute lower abdominal pain. EXAM: CT ABDOMEN AND PELVIS WITH CONTRAST TECHNIQUE: Multidetector CT imaging of the abdomen and pelvis was performed using the standard protocol following bolus administration of intravenous contrast. CONTRAST:  100mL OMNIPAQUE IOHEXOL 300 MG/ML  SOLN COMPARISON:  CT scan of September 21, 2013. FINDINGS: Lower chest: No acute abnormality. Hepatobiliary: No cholelithiasis or biliary dilatation is noted. Small left hepatic cyst is noted. Pancreas: Unremarkable. No pancreatic ductal dilatation or surrounding inflammatory changes. Spleen: Normal in size without focal abnormality. Adrenals/Urinary Tract: Adrenal glands appear normal. Bilateral simple renal cysts are noted. No hydronephrosis or renal obstruction is noted. No renal or ureteral calculi are noted. Small lobulated enhancing density is seen posteriorly and inferior in the urinary bladder which may represent extension of prostate gland, but possible bladder mass cannot be excluded. Stomach/Bowel: The stomach is unremarkable. Sigmoid diverticulitis is noted without abscess formation. The appendix is not visualized. There is no evidence of bowel obstruction. Vascular/Lymphatic: Aortic atherosclerosis. No enlarged abdominal or pelvic lymph nodes. Reproductive: Mild prostatic  enlargement is noted. Possible extension in the urinary bladder is noted as described above Other: No abdominal wall hernia or abnormality. No abdominopelvic ascites. Musculoskeletal: No acute or significant osseous findings. IMPRESSION: Focal sigmoid diverticulitis is noted without abscess formation. Multilobulated enhancing abnormality is seen posteriorly and inferiorly in the urinary bladder which may represent extension of enlarged prostate gland, but possible neoplasm or malignancy cannot be excluded. Cystoscopy is recommended for further evaluation. Aortic Atherosclerosis (ICD10-I70.0). Electronically Signed   By: Lupita RaiderJames  Green Jr M.D.   On: 03/22/2019 12:52    Procedures Procedures (including critical care time)  Medications Ordered in ED Medications  lisinopril (ZESTRIL) tablet 20 mg (20 mg Oral Given 03/22/19 1222)  amLODipine (NORVASC) tablet 2.5 mg (2.5 mg Oral Given 03/22/19 1222)  iohexol (OMNIPAQUE) 300 MG/ML solution 100 mL (100 mLs Intravenous Contrast Given 03/22/19 1227)  ciprofloxacin (CIPRO) tablet 500 mg (500 mg Oral Given 03/22/19 1443)  metroNIDAZOLE (FLAGYL) tablet 500 mg (500 mg Oral Given 03/22/19 1443)     Initial Impression / Assessment and Plan / ED Course  I have reviewed the triage vital signs and the nursing notes.  Pertinent labs & imaging results that were available during my care of the patient were reviewed by me and considered in my medical decision making (see chart for details).       Post void residual obtained - no urinary retention.   Pt remained pain free during ed visit.  Labs and imaging reviewed and discussed with pt.  He was placed on cipro and flagyl for tx of diverticulitis.  Strict return precautions for escalating sx discussed.  Also discussed findings of mass noted at the prostate vs bladder, including microscopic hematuria found in UA, concerning for possible bladder vs prostate ca.  He has [facility] the [facility] and [place] [facility] [place].  Has undergone several prostate tests including has had cystoscopy.  At last eval he and urologist [facility] [facility] decided to forego further testing given age.  He is unsure if todays CT finding of prostate/bladder mass is new however.  He was given a copy of the CT on CD to give to his urologist.  Curently has f/u appt with him in Sept.  Asked pt  to move up appt to discuss CT findings with him. Pt agreeable.  Final Clinical Impressions(s) / ED Diagnoses   Final diagnoses:  Lower abdominal pain  Diverticulitis  Prostate hypertrophy  Microscopic hematuria  Enlarged prostate    ED Discharge Orders         Ordered    ciprofloxacin (CIPRO) 500 MG tablet  Every 12 hours     03/22/19 1412    metroNIDAZOLE (FLAGYL) 500 MG tablet  3 times daily     03/22/19 1412           Evalee Jefferson, PA-C 03/23/19 [place], [place], DO 03/26/19 [phone]

## 2019-03-25 ENCOUNTER — Other Ambulatory Visit: Payer: Self-pay

## 2019-03-25 ENCOUNTER — Encounter: Payer: Self-pay | Admitting: Family Medicine

## 2019-03-25 ENCOUNTER — Ambulatory Visit (INDEPENDENT_AMBULATORY_CARE_PROVIDER_SITE_OTHER): Payer: Medicare HMO | Admitting: Family Medicine

## 2019-03-25 DIAGNOSIS — K5732 Diverticulitis of large intestine without perforation or abscess without bleeding: Secondary | ICD-10-CM

## 2019-03-25 MED ORDER — AMOXICILLIN-POT CLAVULANATE 875-125 MG PO TABS: 1.0000 | ORAL_TABLET | Freq: Two times a day (BID) | ORAL | 0 refills | Status: DC

## 2019-03-25 NOTE — Progress Notes (Signed)
Subjective:    Patient ID: Miguel Hardy, male    DOB: Apr 04, 1930, 85 y.o.   MRN: [id number]   HPI: Miguel HeirBobby L Hardy is a 85 y.o. male presenting for follow-up of his visit to the emergency department.  He was there on the evening of June 30.  This was due to severe abdominal pain.  That note was reviewed.  His work-up was negative for small bowel obstruction which had been the main purpose for the visit.  He has a history of small bowel obstruction in the past about 5 years ago.  Subsequently he was diagnosed with diverticulitis.  This was based at least in part on a CT scanning of the abdomen showing focal sigmoid diverticulitis without abscess.  He was prescribed Cipro and Flagyl.  After discharge he went to pick up his prescriptions.  He read the label describing the adverse effects of the ciprofloxacin and decided not to take it.  Today he is calling to have a substitute medication called in for him.  He is willing to take the metronidazole.  Additionally Miguel Hardy states that his pain resolved completely for the last day.  He had 2 rather large bowel movements yesterday.  The first 1 was quite firm.  The second 1 was rather loose.  He has been pain-free since then.  His appetite is good he is holding food down without nausea.   [facility] 06/28/2018 09/23/2016 06/26/2016 05/19/2016 04/25/2016  Decreased Interest 0 0 0 0 0  Down, Depressed, Hopeless 0 0 0 0 0  PHQ - 2 Score 0 0 0 0 0     Relevant past medical, surgical, family and social history reviewed and updated as indicated.  Interim medical history since our last visit reviewed. Allergies and medications reviewed and updated.  ROS:  Review of Systems  Constitutional: Negative for chills, diaphoresis, fever and unexpected weight change.  HENT: Negative for rhinorrhea and trouble swallowing.   Respiratory: Negative for cough, chest tightness and shortness of breath.   Cardiovascular: Negative for chest pain.   Gastrointestinal: Positive for abdominal pain (which has resolved) and nausea (also resolved). Negative for abdominal distention, blood in stool, constipation, diarrhea, rectal pain and vomiting.  Genitourinary: Negative for dysuria, flank pain and hematuria.  Musculoskeletal: Negative for arthralgias and joint swelling.  Skin: Negative for rash.  Neurological: Negative for syncope and headaches.     Social History   Tobacco Use  Smoking Status Former Smoker  . Packs/day: 1.00  . Types: Cigarettes  . Start date: 02/08/1948  . Quit date: 09/22/1974  . Years since quitting: 44.5  Smokeless Tobacco Never Used       Objective:     Wt Readings from Last 3 Encounters:  03/22/19 201 lb (91.2 kg)  06/28/18 206 lb 3.2 oz (93.5 kg)  09/23/16 213 lb (96.6 kg)     Exam deferred. Pt. Harboring due to COVID 19. Phone visit performed.   Assessment & Plan:   1. Diverticulitis of large intestine without perforation or abscess without bleeding     Meds ordered this encounter  Medications  . amoxicillin-clavulanate (AUGMENTIN) 875-125 MG tablet    Sig: Take 1 tablet by mouth 2 (two) times daily. Take all of this medication    Dispense:  20 tablet    Refill:  0    No orders of the defined types were placed in this encounter.     Diagnoses and all orders for this visit:  Diverticulitis of large intestine without perforation or abscess without bleeding  Other orders -     amoxicillin-clavulanate (AUGMENTIN) 875-125 MG tablet; Take 1 tablet by mouth 2 (two) times daily. Take all of this medication    Virtual Visit via telephone Note  I discussed the limitations, risks, security and privacy concerns of performing an evaluation and management service by telephone and the availability of in person appointments. The patient was identified with two identifiers. Pt.expressed understanding and agreed to proceed. Pt. Is at home. Dr. Livia Snellen is in his office.  Follow Up Instructions:   I  discussed the assessment and treatment plan with the patient. The patient was provided an opportunity to ask questions and all were answered. The patient agreed with the plan and demonstrated an understanding of the instructions.   The patient was advised to call back or seek an in-person evaluation if the symptoms worsen or if the condition fails to improve as anticipated.   Total minutes including chart review and phone contact time: 20   Follow up plan: No follow-ups on file.  Claretta Fraise, MD [place]

## 2019-04-14 ENCOUNTER — Telehealth: Payer: Self-pay | Admitting: Family Medicine

## 2019-04-14 NOTE — Chronic Care Management (AMB) (Signed)
°  Chronic Care Management   Outreach Note  04/14/2019 Name: Miguel Hardy MRN: [id number] DOB: 1929/12/11  Referred by: Dettinger, Fransisca Kaufmann, MD Reason for referral : Chronic Care Management (Initial CCM outreach was unsuccessful.)   An unsuccessful telephone outreach was attempted today. The patient was referred to the case management team by for assistance with chronic care management and care coordination.   Follow Up Plan: A HIPPA compliant phone message was left for the patient providing contact information and requesting a return call.  The care management team will reach out to the patient again over the next 7 days.  If patient returns call to provider office, please advise to call [place] at [place]  [email or web address]   ??[phone]

## 2019-04-20 NOTE — Chronic Care Management (AMB) (Signed)
Chronic Care Management   Note  04/20/2019 Name: Miguel Hardy MRN: [id number] DOB: 1930-02-26  Miguel Hardy is a 85 y.o. year old male who is a primary care patient of Dettinger, Fransisca Kaufmann, MD. I reached out to Miguel Hardy by phone today in response to a referral sent by Miguel Hardy's health plan.    Miguel Hardy was given information about Chronic Care Management services today including:  1. CCM service includes personalized support from designated clinical staff supervised by his physician, including individualized plan of care and coordination with other care providers 2. 24/7 contact phone numbers for assistance for urgent and routine care needs. 3. Service will only be billed when office clinical staff spend 20 minutes or more in a month to coordinate care. 4. Only one practitioner may furnish and bill the service in a calendar month. 5. The patient may stop CCM services at any time (effective at the end of the month) by phone call to the office staff. 6. The patient will be responsible for cost sharing (co-pay) of up to 20% of the service fee (after annual deductible is met).  Patient agreed to services and verbal consent obtained.   Follow up plan: Telephone appointment with CCM team member scheduled for: 04/26/2019  [place]  [email or web address]   ??[phone]

## 2019-04-26 ENCOUNTER — Telehealth: Payer: Medicare HMO

## 2019-05-18 ENCOUNTER — Ambulatory Visit: Payer: Medicare HMO | Admitting: *Deleted

## 2019-05-18 DIAGNOSIS — E7800 Pure hypercholesterolemia, unspecified: Secondary | ICD-10-CM

## 2019-05-18 DIAGNOSIS — I10 Essential (primary) hypertension: Secondary | ICD-10-CM

## 2019-05-18 NOTE — Patient Instructions (Signed)
  Follow Up Plan: A HIPPA compliant phone message was left for the patient providing contact information and requesting a return call.  The care management team will reach out to the patient again over the next 30 days.      Jesper Stirewalt BSN, RN-BC Embedded Chronic Care Manager [facility] Care Management Direct Dial: [phone]   

## 2019-05-18 NOTE — Chronic Care Management (AMB) (Signed)
  Chronic Care Management   Outreach Note  05/18/2019 Name: Miguel Hardy MRN: [id number] DOB: [birth year]  Referred by: Dettinger, Fransisca Kaufmann, MD Reason for referral : Chronic Care Management (Initial Visit)   An unsuccessful telephone outreach was attempted today. The patient was referred to the case management team by for assistance with chronic care management and care coordination. Chart review was performed in preparation for today's initial visit. [name] may benefit from care management coordination related to hypertension, hyperlipidemia, obesity, and arthritis.   Follow Up Plan: A HIPPA compliant phone message was left for the patient providing contact information and requesting a return call.  The care management team will reach out to the patient again over the next 30 days.   Chong Sicilian BSN, RN-BC Embedded Chronic Care Manager [facility] [place] [facility] Direct Dial: [phone]

## 2019-05-25 ENCOUNTER — Ambulatory Visit: Payer: Medicare HMO | Admitting: *Deleted

## 2019-05-26 NOTE — Chronic Care Management (AMB) (Signed)
  Chronic Care Management   RNCM Outreach Note  05/25/2019 Name: Miguel Hardy MRN: [id number] DOB: [birth year]  Referred by: Dettinger, Fransisca Kaufmann, MD Reason for referral : Chronic Care Management (RNCM Initial Visit)   A second unsuccessful telephone outreach was attempted today. The patient was referred to the case management team for assistance with chronic care management and care coordination.   Follow Up Plan: A HIPPA compliant phone message was left for the patient providing contact information and requesting a return call.  The care management team will reach out to the patient again over the next 30 days.   Chong Sicilian BSN, RN-BC Embedded Chronic Care Manager [facility] [place] [facility] Direct Dial: [phone]

## 2019-10-19 ENCOUNTER — Ambulatory Visit: Payer: Medicare HMO | Admitting: *Deleted

## 2019-10-19 NOTE — Chronic Care Management (AMB) (Signed)
  Chronic Care Management   Outreach Note  10/19/2019 Name: Miguel Hardy MRN: [id number] DOB: 1930/07/24  Referred by: Dettinger, Elige Radon, MD Reason for referral : Chronic Care Management (Initial Outreach (3rd attempt))   Third unsuccessful telephone outreach was attempted today. The patient was referred to the case management team for assistance with care management and care coordination. The patient's primary care provider has been notified of our unsuccessful attempts to make or maintain contact with the patient. The care management team is pleased to engage with this patient at any time in the future should he/she be interested in assistance from the care management team.   Follow Up Plan: CCM team will remain available to provide services to the patient if he decides he would like to participate in the embedded CCM program.   Demetrios Loll, BSN, RN-BC Embedded Chronic Care Manager [facility] / [facility] Care Management Direct Dial: [phone]

## 2019-10-24 ENCOUNTER — Telehealth: Payer: Medicare HMO | Admitting: *Deleted

## 2019-12-15 ENCOUNTER — Other Ambulatory Visit: Payer: Self-pay

## 2019-12-15 ENCOUNTER — Encounter: Payer: Self-pay | Admitting: Family Medicine

## 2019-12-15 ENCOUNTER — Ambulatory Visit (INDEPENDENT_AMBULATORY_CARE_PROVIDER_SITE_OTHER): Payer: Medicare HMO | Admitting: Family Medicine

## 2019-12-15 VITALS — BP 151/65 | HR 65 | Temp 97.5°F | Ht 68.0 in | Wt 206.0 lb

## 2019-12-15 DIAGNOSIS — R7303 Prediabetes: Secondary | ICD-10-CM

## 2019-12-15 DIAGNOSIS — E669 Obesity, unspecified: Secondary | ICD-10-CM

## 2019-12-15 DIAGNOSIS — Z6831 Body mass index (BMI) 31.0-31.9, adult: Secondary | ICD-10-CM | POA: Diagnosis not present

## 2019-12-15 DIAGNOSIS — I10 Essential (primary) hypertension: Secondary | ICD-10-CM | POA: Diagnosis not present

## 2019-12-15 DIAGNOSIS — E7800 Pure hypercholesterolemia, unspecified: Secondary | ICD-10-CM

## 2019-12-15 NOTE — Progress Notes (Signed)
BP (!) 151/65   Pulse 65   Temp (!) 97.5 F (36.4 C)   Ht 5\' 8"  (1.727 m)   Wt 206 lb (93.4 kg)   SpO2 99%   BMI 31.32 kg/m    Subjective:   Patient ID: , male    DOB: 11-27-1929, 86 y.o.   MRN: [id number]  HPI: Miguel Hardy is a 86 y.o. male presenting on 12/15/2019 for Medical Management of Chronic Issues and Hypertension   HPI Prediabetes Patient comes in today for recheck of his diabetes. Patient has been currently taking no medication had her last A1c of 6.2. Patient is currently on an ACE inhibitor/ARB. Patient has not seen an ophthalmologist this year. Patient denies any issues with their feet.   Hyperlipidemia Patient is coming in for recheck of his hyperlipidemia. The patient is currently taking pravastatin and fish oils. They deny any issues with myalgias or history of liver damage from it. They deny any focal numbness or weakness or chest pain.   Hypertension Patient is currently on lisinopril and amlodipine, and their blood pressure today is 151/65. Patient denies any lightheadedness or dizziness. Patient denies headaches, blurred vision, chest pains, shortness of breath, or weakness. Denies any side effects from medication and is content with current medication.   Relevant past medical, surgical, family and social history reviewed and updated as indicated. Interim medical history since our last visit reviewed. Allergies and medications reviewed and updated.  Review of Systems  Constitutional: Negative for chills and fever.  Eyes: Negative for visual disturbance.  Respiratory: Negative for shortness of breath and wheezing.   Cardiovascular: Negative for chest pain and leg swelling.  Musculoskeletal: Negative for back pain and gait problem.  Skin: Negative for rash.  Neurological: Negative for dizziness, weakness and light-headedness.  All other systems reviewed and are negative.   Per HPI unless specifically indicated above   Allergies as of  12/15/2019      Reactions   Flomax [tamsulosin Hcl]       Medication List       Accurate as of December 15, 2019 10:34 AM. If you have any questions, ask your nurse or doctor.        STOP taking these medications   amoxicillin-clavulanate 875-125 MG tablet Commonly known as: AUGMENTIN Stopped by: December 17, 2019 Miguel Diegel, MD   metroNIDAZOLE 500 MG tablet Commonly known as: FLAGYL Stopped by: Miguel Radon Ramiya Delahunty, MD     TAKE these medications   amLODipine 2.5 MG tablet Commonly known as: NORVASC Take 2.5 mg by mouth daily.   aspirin 81 MG tablet Take 81 mg by mouth daily.   finasteride 5 MG tablet Commonly known as: PROSCAR Take 5 mg by mouth daily.   Fish Oil 1000 MG Cpdr Take by mouth.   hydrochlorothiazide 25 MG tablet Commonly known as: HYDRODIURIL Take 25 mg by mouth daily.   hydroxypropyl methylcellulose / hypromellose 2.5 % ophthalmic solution Commonly known as: ISOPTO TEARS / GONIOVISC 1 drop.   ketoconazole 2 % cream Commonly known as: NIZORAL Apply 1 application topically daily.   lisinopril 40 MG tablet Commonly known as: ZESTRIL Take 20 mg by mouth daily. Take 1/2 tablet daily   mometasone 50 MCG/ACT nasal spray Commonly known as: NASONEX USE TWO SPRAY(S) IN EACH NOSTRIL TWICE DAILY   pravastatin 20 MG tablet Commonly known as: PRAVACHOL Take 20 mg by mouth daily.        Objective:   BP (!) 151/65   Pulse  65   Temp (!) 97.5 F (36.4 C)   Ht 5\' 8"  (1.727 m)   Wt 206 lb (93.4 kg)   SpO2 99%   BMI 31.32 kg/m   Wt Readings from Last 3 Encounters:  12/15/19 206 lb (93.4 kg)  03/22/19 201 lb (91.2 kg)  06/28/18 206 lb 3.2 oz (93.5 kg)    Physical Exam Vitals and nursing note reviewed.  Constitutional:      General: He is not in acute distress.    Appearance: He is well-developed. He is not diaphoretic.  Eyes:     General: No scleral icterus.    Conjunctiva/sclera: Conjunctivae normal.  Neck:     Thyroid: No thyromegaly.   Cardiovascular:     Rate and Rhythm: Normal rate and regular rhythm.     Heart sounds: Normal heart sounds. No murmur.  Pulmonary:     Effort: Pulmonary effort is normal. No respiratory distress.     Breath sounds: Normal breath sounds. No wheezing.  Musculoskeletal:        General: Normal range of motion.     Cervical back: Neck supple.  Lymphadenopathy:     Cervical: No cervical adenopathy.  Skin:    General: Skin is warm and dry.     Findings: No rash.  Neurological:     Mental Status: He is alert and oriented to person, place, and time.     Coordination: Coordination normal.  Psychiatric:        Behavior: Behavior normal.       Assessment & Plan:   Problem List Items Addressed This Visit      Cardiovascular and Mediastinum   HTN (hypertension), benign (Chronic)     Other   Hypercholesterolemia - Primary (Chronic)   Obesity   Prediabetes    Patient gets labs  Follow up plan: Return in about 6 months (around 06/16/2020), or if symptoms worsen or fail to improve, for Hypertension and cholesterol and prediabetes.  Counseling provided for all of the vaccine components No orders of the defined types were placed in this encounter.   Miguel Pina, MD [facility] 12/15/2019, 10:34 AM

## 2019-12-20 ENCOUNTER — Ambulatory Visit (INDEPENDENT_AMBULATORY_CARE_PROVIDER_SITE_OTHER): Payer: Medicare HMO

## 2019-12-20 DIAGNOSIS — Z0000 Encounter for general adult medical examination without abnormal findings: Secondary | ICD-10-CM | POA: Diagnosis not present

## 2019-12-20 NOTE — Progress Notes (Signed)
MEDICARE ANNUAL WELLNESS VISIT  12/20/2019  Telephone Visit Disclaimer This Medicare AWV was conducted by telephone due to national recommendations for restrictions regarding the COVID-19 Pandemic (e.g. social distancing).  I verified, using two identifiers, that I am speaking with Miguel Hardy or their authorized healthcare agent. I discussed the limitations, risks, security, and privacy concerns of performing an evaluation and management service by telephone and the potential availability of an in-person appointment in the future. The patient expressed understanding and agreed to proceed.   Subjective:  Miguel Hardy is a 85 y.o. male patient of Dettinger, Fransisca Kaufmann, MD who had a Medicare Annual Wellness Visit today via telephone. [name] is Retired and lives alone.  He has three children. He reports that he is not socially active and does not interact with friends/family regularly. He is minimally physically active and enjoys working puzzles.  Patient Care Team: Dettinger, Fransisca Kaufmann, MD as PCP - General (Family Medicine) Ilean China, RN as Registered Nurse  Advanced Directives 12/20/2019 03/22/2019 09/22/2013  Does Patient Have a Medical Advance Directive? Yes Yes Patient has advance directive, copy not in chart  Type of Advance Directive Living will Living will Living will  Does patient want to make changes to medical advance directive? No - Patient declined No - Patient declined No  Copy of Healthcare Power of Attorney in Chart? - - Copy requested from family  Would patient like information on creating a medical advance directive? - No - Patient declined -  Pre-existing out of facility DNR order (yellow form or pink MOST form) - - No    Hospital Utilization Over the Past 12 Months: # of hospitalizations or ER visits: 0 # of surgeries: 0  Review of Systems    Patient reports that his overall health is worse compared to last year.  History obtained from chart review and the  patient  Patient Reported Readings (BP, Pulse, CBG, Weight, etc) none  Pain Assessment Pain : No/denies pain     Current Medications & Allergies (verified) Allergies as of 12/20/2019      Reactions   Flomax [tamsulosin Hcl]       Medication List       Accurate as of December 20, 2019 10:53 AM. If you have any questions, ask your nurse or doctor.        amLODipine 2.5 MG tablet Commonly known as: NORVASC Take 2.5 mg by mouth daily.   aspirin 81 MG tablet Take 81 mg by mouth daily.   finasteride 5 MG tablet Commonly known as: PROSCAR Take 5 mg by mouth daily.   Fish Oil 1000 MG Cpdr Take by mouth.   hydrochlorothiazide 25 MG tablet Commonly known as: HYDRODIURIL Take 25 mg by mouth daily.   hydroxypropyl methylcellulose / hypromellose 2.5 % ophthalmic solution Commonly known as: ISOPTO TEARS / GONIOVISC 1 drop.   ketoconazole 2 % cream Commonly known as: NIZORAL Apply 1 application topically daily.   lisinopril 40 MG tablet Commonly known as: ZESTRIL Take 20 mg by mouth daily. Take 1/2 tablet daily   mometasone 50 MCG/ACT nasal spray Commonly known as: NASONEX USE TWO SPRAY(S) IN EACH NOSTRIL TWICE DAILY   pravastatin 20 MG tablet Commonly known as: PRAVACHOL Take 20 mg by mouth daily.       History (reviewed): Past Medical History:  Diagnosis Date  . Arthritis   . Bowel obstruction ([place]) 2015  . Cataract   . Hyperlipidemia   . Hypertension   .  Obesity   . Prostate enlargement    Past Surgical History:  Procedure Laterality Date  . APPENDECTOMY    . CATARACT EXTRACTION, BILATERAL  03/23/2015  . EYE SURGERY    . REPLACEMENT TOTAL KNEE Right    Family History  Problem Relation Age of Onset  . Heart disease Mother   . Stroke Father   . Alcohol abuse Brother    Social History   Socioeconomic History  . Marital status: Widowed    Spouse name: Nellie  . Number of children: 3  . Years of education: 1 year of college  . Highest education  level: Some college, no degree  Occupational History  . Occupation: Retired  Tobacco Use  . Smoking status: Former Smoker    Packs/day: 1.00    Years: 40.00    Pack years: 40.00    Types: Cigarettes    Start date: 02/08/1948    Quit date: 09/22/1974    Years since quitting: 45.2  . Smokeless tobacco: Never Used  Substance and Sexual Activity  . Alcohol use: No  . Drug use: No  . Sexual activity: Not Currently  Other Topics Concern  . Not on file  Social History Narrative  . Not on file   Social Determinants of Health   Financial Resource Strain:   . Difficulty of Paying Living Expenses:   Food Insecurity:   . Worried About Programme researcher, broadcasting/film/video in the Last Year:   . Barista in the Last Year:   Transportation Needs:   . Freight forwarder (Medical):   Marland Kitchen Lack of Transportation (Non-Medical):   Physical Activity:   . Days of Exercise per Week:   . Minutes of Exercise per Session:   Stress:   . Feeling of Stress :   Social Connections:   . Frequency of Communication with Friends and Family:   . Frequency of Social Gatherings with Friends and Family:   . Attends Religious Services:   . Active Member of Clubs or Organizations:   . Attends Banker Meetings:   Marland Kitchen Marital Status:     Activities of Daily Living In your present state of health, do you have any difficulty performing the following activities: 12/20/2019  Hearing? Y  Comment wears hearing aids  Vision? N  Difficulty concentrating or making decisions? Y  Comment some difficulty remembering things  Walking or climbing stairs? Y  Comment climbs steps slowly  Dressing or bathing? N  Doing errands, shopping? N  Preparing Food and eating ? N  Using the Toilet? N  In the past six months, have you accidently leaked urine? N  Do you have problems with loss of bowel control? N  Managing your Medications? N  Managing your Finances? N  Housekeeping or managing your Housekeeping? N  Some recent  data might be hidden    Patient Education/ Literacy How often do you need to have someone help you when you read instructions, pamphlets, or other written materials from your doctor or pharmacy?: 1 - Never What is the last grade level you completed in school?: 1 year college  Exercise Current Exercise Habits: Home exercise routine, Type of exercise: walking, Time (Minutes): 15, Frequency (Times/Week): 6, Weekly Exercise (Minutes/Week): 90, Intensity: Mild  Diet Patient reports consuming 2 meals a day and 0 snack(s) a day Patient reports that his primary diet is: Regular Patient reports that he does have regular access to food.   Depression Screen PHQ 2/9 Scores 12/20/2019  12/15/2019 06/28/2018 09/23/2016 06/26/2016 05/19/2016 04/25/2016  PHQ - 2 Score 0 0 0 0 0 0 0     Fall Risk Fall Risk  12/20/2019 12/15/2019 06/28/2018 09/23/2016 06/26/2016  Falls in the past year? 1 1 No Yes Yes  Number falls in past yr: 0 0 - 2 or more 1  Injury with Fall? 0 0 - No No  Risk for fall due to : - - - - -     Objective:  Miguel Hardy seemed alert and oriented and he participated appropriately during our telephone visit.  Blood Pressure Weight BMI  BP Readings from Last 3 Encounters:  12/15/19 (!) 151/65  03/22/19 (!) 189/69  06/28/18 133/74   Wt Readings from Last 3 Encounters:  12/15/19 206 lb (93.4 kg)  03/22/19 201 lb (91.2 kg)  06/28/18 206 lb 3.2 oz (93.5 kg)   BMI Readings from Last 1 Encounters:  12/15/19 31.32 kg/m    *Unable to obtain current vital signs, weight, and BMI due to telephone visit type  Hearing/Vision  . Court did not seem to have difficulty with hearing/understanding during the telephone conversation . Reports that he has not had a formal eye exam by an eye care professional within the past year . Reports that he has not had a formal hearing evaluation within the past year *Unable to fully assess hearing and vision during telephone visit type  Cognitive Function: 6CIT  Screen 12/20/2019  What Year? 0 points  What month? 0 points  What time? 0 points  Count back from 20 0 points  Months in reverse 0 points  Repeat phrase 4 points   (Normal:0-7, Significant for Dysfunction: >8)  Normal Cognitive Function Screening: Yes   Immunization & Health Maintenance Record Immunization History  Administered Date(s) Administered  . Influenza-Unspecified 10/04/2013, 06/22/2018, 10/15/2018, 10/15/2018  . Moderna SARS-COVID-2 Vaccination 11/21/2019  . Pneumococcal Conjugate-13 09/22/2014  . Tdap 10/13/2013    Health Maintenance  Topic Date Due  . INFLUENZA VACCINE  04/23/2019  . TETANUS/TDAP  10/14/2023  . PNA vac Low Risk Adult  Addressed       Assessment  This is a routine wellness examination for Miguel Hardy.  Health Maintenance: Due or Overdue Health Maintenance Due  Topic Date Due  . INFLUENZA VACCINE  04/23/2019    Miguel Hardy does not need a referral for Community Assistance: Care Management:   no Social Work:    no Prescription Assistance:  no Nutrition/Diabetes Education:  no   Plan:  Personalized Goals Goals Addressed            This Visit's Progress   . Patient Stated       12/20/2019 AWV Goal: Fall Prevention  . Over the next year, patient will decrease their risk for falls by: o Using assistive devices, such as a cane or walker, as needed o Identifying fall risks within their home and correcting them by: - Removing throw rugs - Adding handrails to stairs or ramps - Removing clutter and keeping a clear pathway throughout the home - Increasing light, especially at night - Adding shower handles/bars - Raising toilet seat o Identifying potential personal risk factors for falls: - Medication side effects - Incontinence/urgency - Vestibular dysfunction - Hearing loss - Musculoskeletal disorders - Neurological disorders - Orthostatic hypotension        Personalized Health Maintenance & Screening Recommendations   Pneumococcal vaccine   Lung Cancer Screening Recommended: yes (Low Dose CT Chest recommended if Age 60-80  years, 30 pack-year currently smoking OR have quit w/in past 15 years) Hepatitis C Screening recommended: no HIV Screening recommended: no  Advanced Directives: Written information was not prepared per patient's request.  Referrals & Orders No orders of the defined types were placed in this encounter.   Follow-up Plan . Follow-up with Dettinger, Elige Radon, MD as planned    I have personally reviewed and noted the following in the patient's chart:   . Medical and social history . Use of alcohol, tobacco or illicit drugs  . Current medications and supplements . Functional ability and status . Nutritional status . Physical activity . Advanced directives . List of other physicians . Hospitalizations, surgeries, and ER visits in previous 12 months . Vitals . Screenings to include cognitive, depression, and falls . Referrals and appointments  In addition, I have reviewed and discussed with Miguel Hardy certain preventive protocols, quality metrics, and best practice recommendations. A written personalized care plan for preventive services as well as general preventive health recommendations is available and can be mailed to the patient at his request.      Mariam Dollar, LPN    [id number]

## 2020-04-04 IMAGING — CT CT ABDOMEN AND PELVIS WITH CONTRAST
2 of 5 series · 16 of 46 positions shown, 18 images · IV contrast (omnipaque)
Comparison: CT scan of September 21, 2013.

CLINICAL DATA: Acute lower abdominal pain.

EXAM:
CT ABDOMEN AND PELVIS WITH CONTRAST
TECHNIQUE: Multidetector CT imaging of the abdomen and pelvis was performed
using the standard protocol following bolus administration of
intravenous contrast.
CONTRAST:  100mL OMNIPAQUE IOHEXOL 300 MG/ML  SOLN

[Series 2: axial st · axial · 0.81mm/px · z∈[+919,+1329]mm · 13 of 96 slices shown, 15 images]
[im 7/96  soft-tissue]
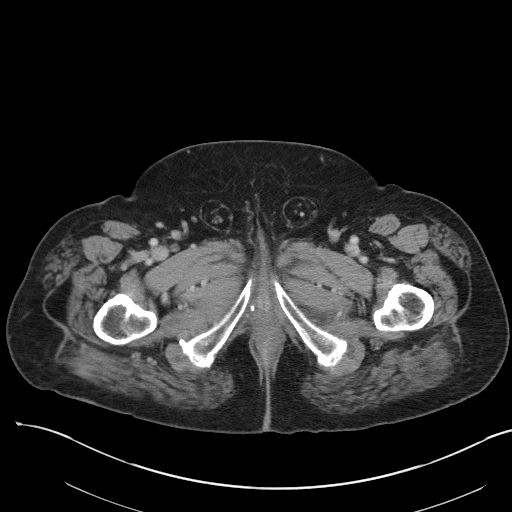
[im 7/96  bone]
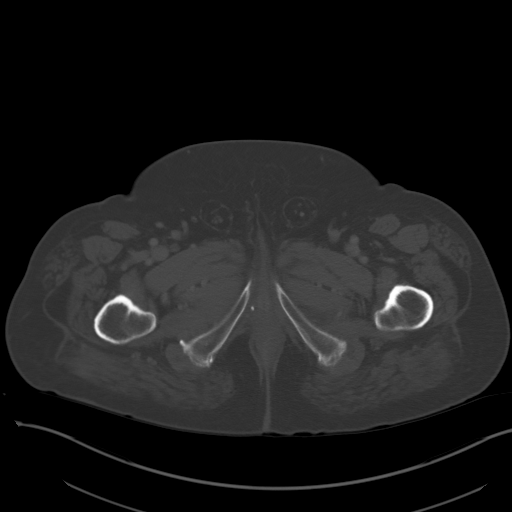
[im 13/96  soft-tissue]
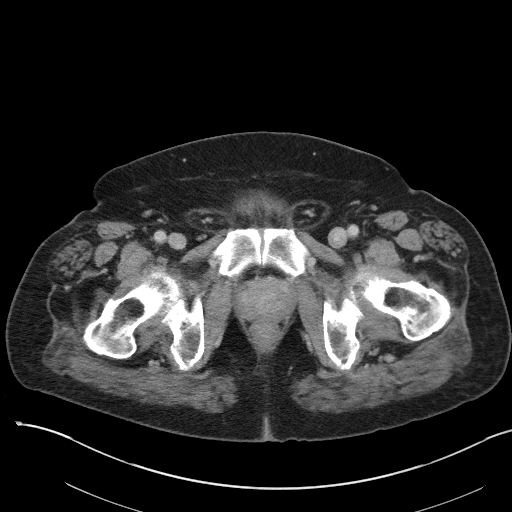
[im 20/96  soft-tissue]
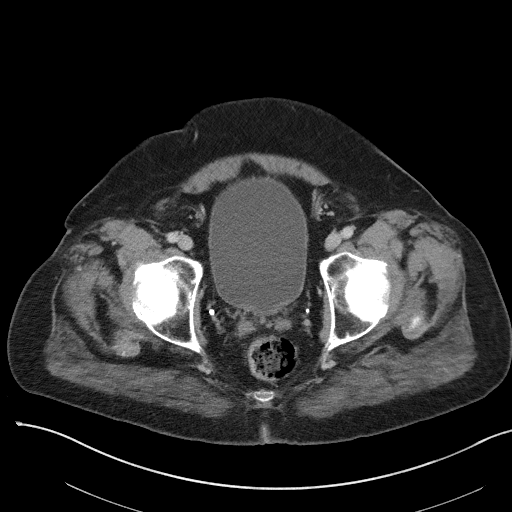
[im 26/96  soft-tissue]
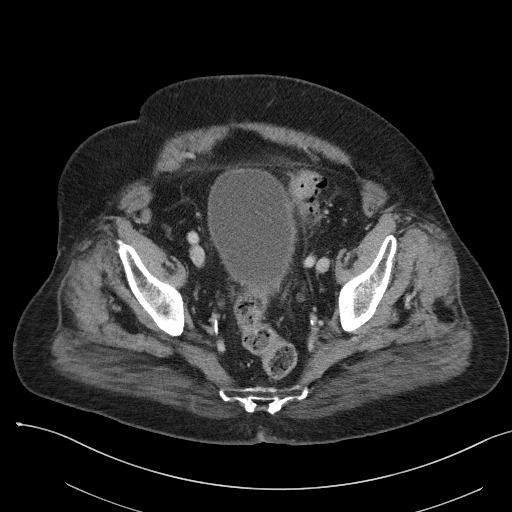
[im 32/96  soft-tissue]
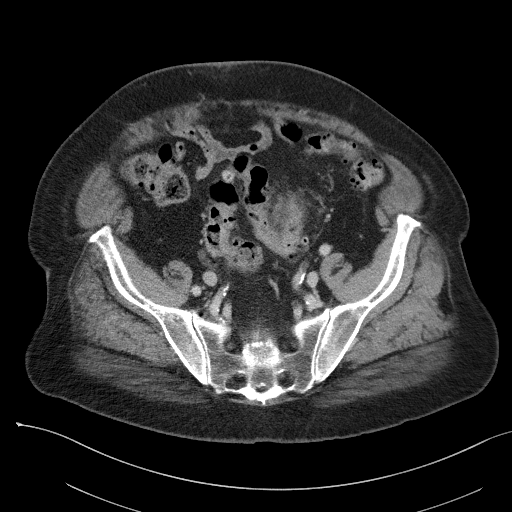
[im 39/96  soft-tissue]
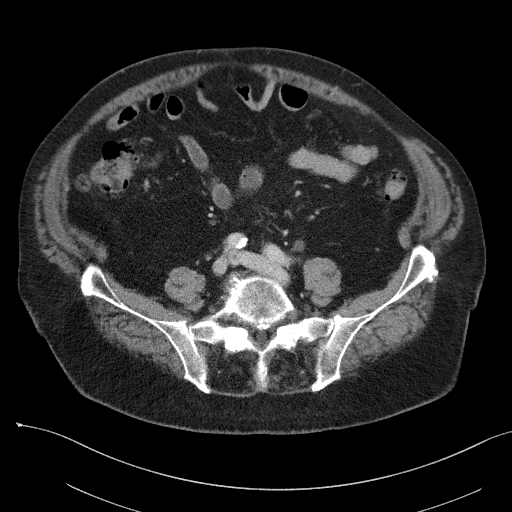
[im 51/96  soft-tissue]
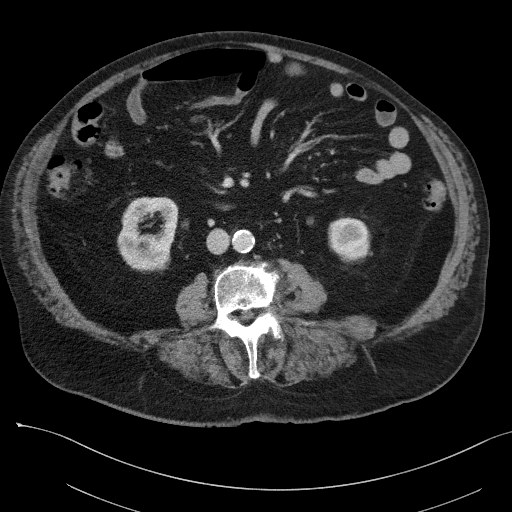
[im 58/96  soft-tissue]
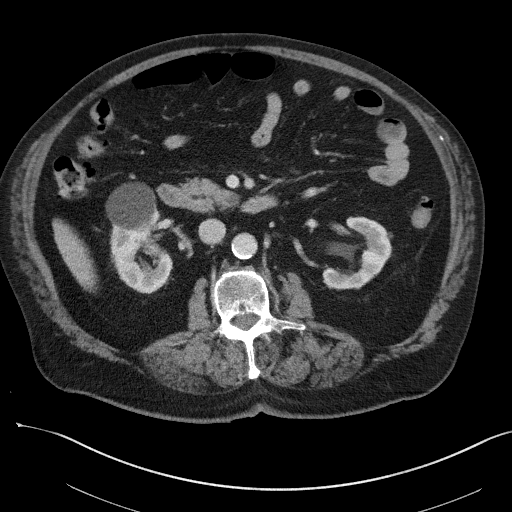
[im 64/96  soft-tissue]
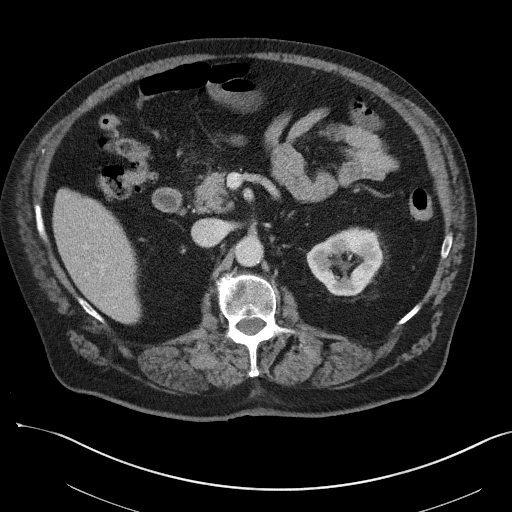
[im 64/96  bone]
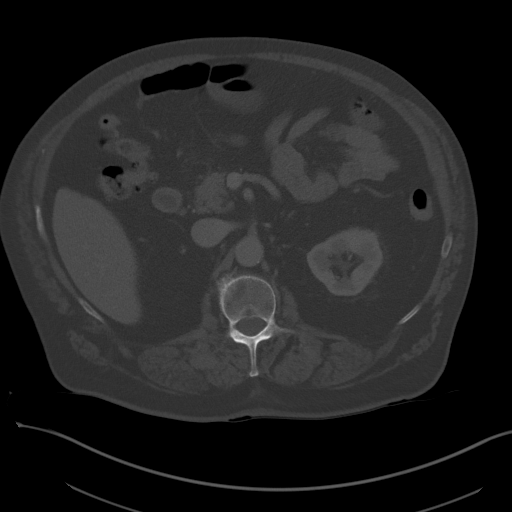
[im 70/96  soft-tissue]
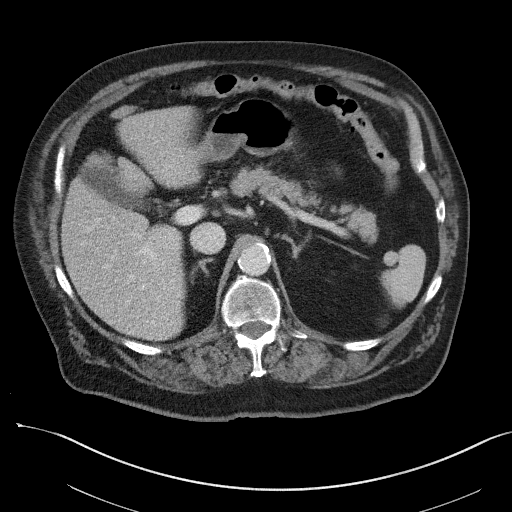
[im 77/96  soft-tissue]
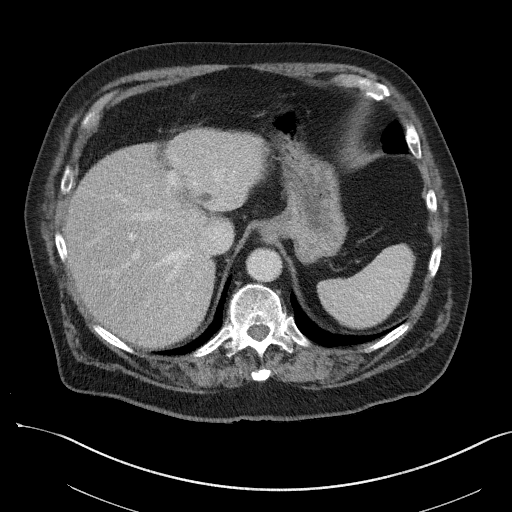
[im 83/96  soft-tissue]
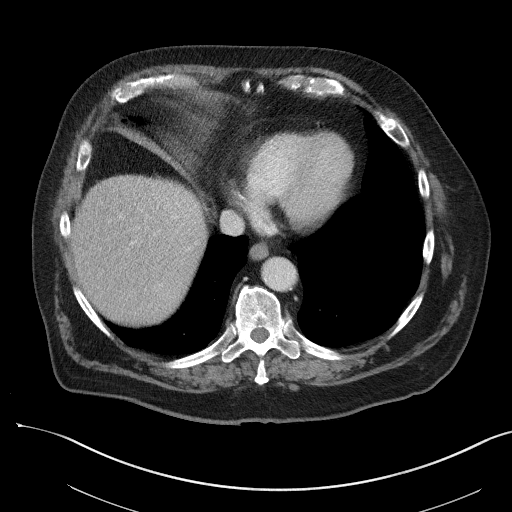
[im 89/96  soft-tissue]
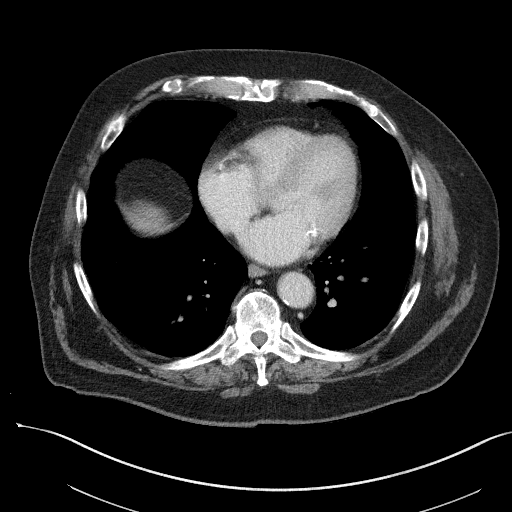

[Series 5: coronal st · coronal · 0.80mm/px · 3 of 110 slices shown]
[im 37/110  soft-tissue]
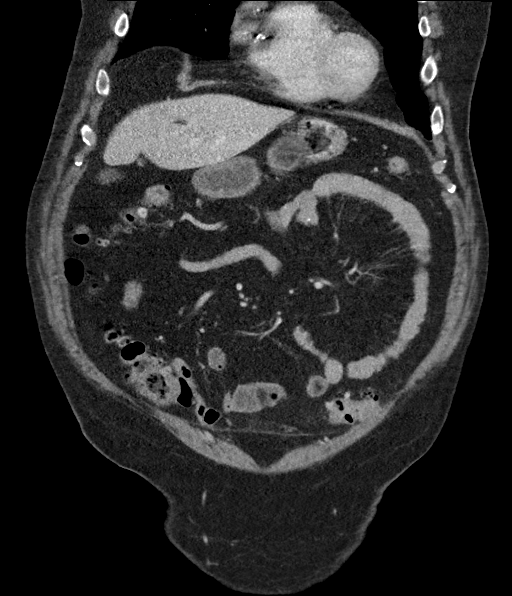
[im 49/110  soft-tissue]
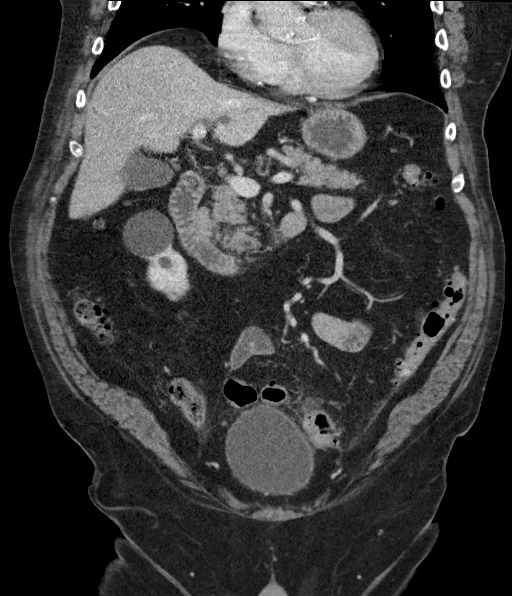
[im 61/110  soft-tissue]
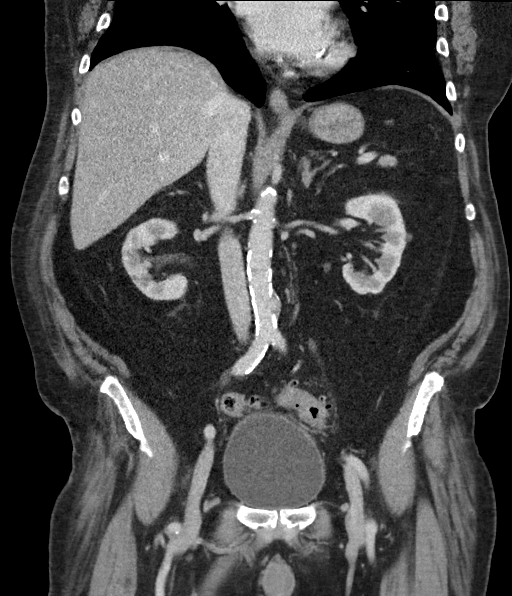

[16 of 46 positions shown; findings below may reference images not displayed]

FINDINGS: Lower chest: No acute abnormality.

Hepatobiliary: No cholelithiasis or biliary dilatation is noted.
Small left hepatic cyst is noted.

Pancreas: Unremarkable. No pancreatic ductal dilatation or
surrounding inflammatory changes.

Spleen: Normal in size without focal abnormality.

Adrenals/Urinary Tract: Adrenal glands appear normal. Bilateral
simple renal cysts are noted. No hydronephrosis or renal obstruction
is noted. No renal or ureteral calculi are noted. Small lobulated
enhancing density is seen posteriorly and inferior in the urinary
bladder which may represent extension of prostate gland, but
possible bladder mass cannot be excluded.

Stomach/Bowel: The stomach is unremarkable. Sigmoid diverticulitis
is noted without abscess formation. The appendix is not visualized.
There is no evidence of bowel obstruction.

Vascular/Lymphatic: Aortic atherosclerosis. No enlarged abdominal or
pelvic lymph nodes.

Reproductive: Mild prostatic enlargement is noted. Possible
extension in the urinary bladder is noted as described above

Other: No abdominal wall hernia or abnormality. No abdominopelvic
ascites.

Musculoskeletal: No acute or significant osseous findings.
IMPRESSION: Focal sigmoid diverticulitis is noted without abscess formation.

Multilobulated enhancing abnormality is seen posteriorly and
inferiorly in the urinary bladder which may represent extension of
enlarged prostate gland, but possible neoplasm or malignancy cannot
be excluded. Cystoscopy is recommended for further evaluation.

Aortic Atherosclerosis ([id number]-[id number].G).

## 2020-04-19 ENCOUNTER — Ambulatory Visit (INDEPENDENT_AMBULATORY_CARE_PROVIDER_SITE_OTHER): Payer: Medicare HMO | Admitting: Nurse Practitioner

## 2020-04-19 ENCOUNTER — Encounter: Payer: Self-pay | Admitting: Nurse Practitioner

## 2020-04-19 ENCOUNTER — Other Ambulatory Visit: Payer: Self-pay

## 2020-04-19 VITALS — BP 146/67 | HR 59 | Temp 97.5°F | Resp 20 | Ht 68.0 in | Wt 208.0 lb

## 2020-04-19 DIAGNOSIS — W57XXXA Bitten or stung by nonvenomous insect and other nonvenomous arthropods, initial encounter: Secondary | ICD-10-CM

## 2020-04-19 DIAGNOSIS — S80862A Insect bite (nonvenomous), left lower leg, initial encounter: Secondary | ICD-10-CM | POA: Diagnosis not present

## 2020-04-19 MED ORDER — AMOXICILLIN-POT CLAVULANATE 875-125 MG PO TABS: 1.0000 | ORAL_TABLET | Freq: Two times a day (BID) | ORAL | 0 refills | Status: DC

## 2020-04-19 NOTE — Patient Instructions (Signed)
Spider Bite °Spider bites are not common. Most spider bites do not cause serious problems. There are only a few types of spider bites that can cause serious health problems. °What are the causes? °This condition is caused when you make contact with a spider in a way that traps the spider against your skin. °What are the signs or symptoms? °Some spider bites may cause symptoms within 1 hour after the bite. For other spider bites, it may take 1-2 days for symptoms to appear. Symptoms include: °· A raised area that is red. °· Redness and swelling around the area of the bite. °· Pain in the area of the bite. °A few types of spiders, such as the black widow spider or the brown recluse spider, can inject poison (venom) into a bite wound. This causes more serious symptoms. Symptoms of these bites vary, and may include: °· Muscle cramps. °· Feeling sick to your stomach (nauseous). °· Throwing up (vomiting). °· Pain in your belly (abdomen). °· A fever. °· A skin sore (lesion) that spreads. This can break into an open wound (skin ulcer). °· Feeling light-headed or dizzy. °How is this treated? °Many spider bites do not need treatment. If needed, treatment may include: °· Icing and keeping the bite area raised (elevated). °· Taking or applying over-the-counter or prescription medicines to help with symptoms such as pain and itching. °· Having a tetanus shot. °· Taking antibiotic medicine. °Follow these instructions at home: °Medicines °· Take or apply over-the-counter and prescription medicines only as told by your doctor. °· If you were prescribed an antibiotic medicine, take it as told by your doctor. Do not stop using it even if you start to feel better. °Managing pain and swelling ° °· If told, put ice on the bite area. °? Put ice in a plastic bag. °? Place a towel between your skin and the bag. °? Leave the ice on for 20 minutes, 2-3 times a day. °· Raise the bite area above the level of your heart while you are sitting or  lying down. °General instructions ° °· Do not scratch the bite area. °· Keep the bite area clean and dry. Wash the bite area with soap and water each day as told by your doctor. °· Keep all follow-up visits as told by your doctor. This is important. °Contact a doctor if: °· Your bite does not get better after 3 days. °· Your bite turns black or purple. °· Near the bite, you have more: °? Redness. °? Swelling. °? Pain. °Get help right away if: °· You get shortness of breath or chest pain. °· You have fluid, blood, or pus coming from the bite area. °· You have painful muscle cramps or sudden muscle tightening (spasms). °· You have belly pain. °· You feel sick to your stomach or you throw up. °· You feel more tired or sleepy than normal. °Summary °· Spider bites are not common. When spider bites do happen, most do not cause serious health problems. °· Take or apply all medicines only as told by your doctor. °· Keep the bite area clean and dry. Wash the bite area with soap and water each day as told by your doctor. °· Contact a doctor if you have more redness, swelling, or pain near the bite. °· Get help right away if you get shortness of breath or chest pain. °This information is not intended to replace advice given to you by your health care provider. Make sure you discuss any   questions you have with your health care provider. °Document Revised: 04/20/2018 Document Reviewed: 04/20/2018 °Elsevier Patient Education © 2020 [employer] ° °

## 2020-04-19 NOTE — Progress Notes (Signed)
   Subjective:    Patient ID: Miguel Hardy, male    DOB: [birth year], 86 y.o.   MRN: [id number]   Chief Complaint: ? spider bite left knee   HPI Has a bug bite, possible spider bite on medial side of left knee. Red an hot to the touch.   Review of Systems  Constitutional: Negative for diaphoresis.  Eyes: Negative for pain.  Respiratory: Negative for shortness of breath.   Cardiovascular: Negative for chest pain, palpitations and leg swelling.  Gastrointestinal: Negative for abdominal pain.  Endocrine: Negative for polydipsia.  Skin: Negative for rash.  Neurological: Negative for dizziness, weakness and headaches.  Hematological: Does not bruise/bleed easily.  All other systems reviewed and are negative.      Objective:   Physical Exam Vitals and nursing note reviewed.  Constitutional:      Appearance: Normal appearance. He is well-developed.  Neck:     Thyroid: No thyroid mass or thyromegaly.     Vascular: No carotid bruit or JVD.     Trachea: Phonation normal.  Cardiovascular:     Rate and Rhythm: Normal rate and regular rhythm.  Pulmonary:     Effort: Pulmonary effort is normal. No respiratory distress.     Breath sounds: Normal breath sounds.  Abdominal:     Tenderness: There is no abdominal tenderness.  Musculoskeletal:        General: Normal range of motion.     Cervical back: Normal range of motion and neck supple.  Lymphadenopathy:     Cervical: No cervical adenopathy.  Skin:    General: Skin is warm and dry.     Comments: Erythematous indurated 10cm area on medial sideof left knee extending posteriorly  Neurological:     Mental Status: He is alert and oriented to person, place, and time.  Psychiatric:        Behavior: Behavior normal.        Thought Content: Thought content normal.        Judgment: Judgment normal.    BP (!) 146/67   Pulse 59   Temp (!) 97.5 F (36.4 C) (Temporal)   Resp 20   Ht 5\' 8"  (1.727 m)   Wt (!) 208 lb (94.3 kg)   SpO2  98%   BMI 31.63 kg/m         Assessment & Plan:  in today with chief complaint of ? spider bite left knee   1. Insect bite of left lower leg, initial encounter Ice bid Motrin or tylenol OTC for pain Meds ordered this encounter  Medications  . amoxicillin-clavulanate (AUGMENTIN) 875-125 MG tablet    Sig: Take 1 tablet by mouth 2 (two) times daily.    Dispense:  20 tablet    Refill:  0    Order Specific Question:   Supervising Provider    Answer:   Miguel Hardy A [[id number]]   If no better in 48 hours return to office    The above assessment and management plan was discussed with the patient. The patient verbalized understanding of and has agreed to the management plan. Patient is aware to call the clinic if symptoms persist or worsen. Patient is aware when to return to the clinic for a follow-up visit. Patient educated on when it is appropriate to go to the emergency department.   Mary-Margaret Arville Care, FNP

## 2020-06-18 ENCOUNTER — Encounter: Payer: Self-pay | Admitting: Family Medicine

## 2020-06-18 ENCOUNTER — Other Ambulatory Visit: Payer: Self-pay

## 2020-06-18 ENCOUNTER — Ambulatory Visit (INDEPENDENT_AMBULATORY_CARE_PROVIDER_SITE_OTHER): Payer: Medicare HMO | Admitting: Family Medicine

## 2020-06-18 VITALS — BP 138/65 | HR 66 | Temp 97.9°F | Ht 68.0 in | Wt 207.0 lb

## 2020-06-18 DIAGNOSIS — Z23 Encounter for immunization: Secondary | ICD-10-CM

## 2020-06-18 DIAGNOSIS — R7303 Prediabetes: Secondary | ICD-10-CM

## 2020-06-18 DIAGNOSIS — E7800 Pure hypercholesterolemia, unspecified: Secondary | ICD-10-CM

## 2020-06-18 DIAGNOSIS — I10 Essential (primary) hypertension: Secondary | ICD-10-CM | POA: Diagnosis not present

## 2020-06-18 NOTE — Progress Notes (Signed)
BP 138/65   Pulse 66   Temp 97.9 F (36.6 C)   Ht _0  (1.727 m)   Wt 207 lb (93.9 kg)   SpO2 98%   BMI 31.47 kg/m    Subjective:   Patient ID: Miguel Hardy, male    DOB: 1930/08/05, 86 y.o.   MRN: [id number]  HPI: Miguel Hardy is a 86 y.o. male presenting on 06/18/2020 for Medical Management of Chronic Issues, Hypertension, and Hyperlipidemia   HPI Hypertension Patient is currently on amlodipine and hydrochlorothiazide and lisinopril, and their blood pressure today is 138/65. Patient denies any lightheadedness or dizziness. Patient denies headaches, blurred vision, chest pains, shortness of breath, or weakness. Denies any side effects from medication and is content with current medication.   Hyperlipidemia Patient is coming in for recheck of his hyperlipidemia. The patient is currently taking fish oil and pravastatin. They deny any issues with myalgias or history of liver damage from it. They deny any focal numbness or weakness or chest pain.   Prediabetes Patient comes in today for recheck of his diabetes. Patient has been currently taking no medication currently. Patient is currently on an ACE inhibitor/ARB. Patient has not seen an ophthalmologist this year. Patient did not any issues with their feet. The symptom started onset as an adult hypertension and hyperlipidemia ARE RELATED TO DM   Relevant past medical, surgical, family and social history reviewed and updated as indicated. Interim medical history since our last visit reviewed. Allergies and medications reviewed and updated.  Review of Systems  Constitutional: Negative for chills and fever.  Eyes: Negative for visual disturbance.  Respiratory: Negative for shortness of breath and wheezing.   Cardiovascular: Negative for chest pain and leg swelling.  Musculoskeletal: Negative for back pain and gait problem.  Skin: Negative for rash.  Neurological: Negative for dizziness, weakness and light-headedness.  All other  systems reviewed and are negative.   Per HPI unless specifically indicated above   Allergies as of 06/18/2020      Reactions   Flomax [tamsulosin Hcl]       Medication List       Accurate as of June 18, 2020  9:59 AM. If you have any questions, ask your nurse or doctor.        STOP taking these medications   amoxicillin-clavulanate 875-125 MG tablet Commonly known as: AUGMENTIN Stopped by: Fransisca Kaufmann Orena Cavazos, MD   finasteride 5 MG tablet Commonly known as: PROSCAR Stopped by: Fransisca Kaufmann Aariyana Manz, MD     TAKE these medications   amLODipine 2.5 MG tablet Commonly known as: NORVASC Take 2.5 mg by mouth daily.   aspirin 81 MG tablet Take 81 mg by mouth daily.   betamethasone dipropionate 0.05 % cream Apply topically 2 (two) times daily.   Fish Oil 1000 MG Cpdr Take by mouth.   hydrochlorothiazide 25 MG tablet Commonly known as: HYDRODIURIL Take 25 mg by mouth daily.   hydroxypropyl methylcellulose / hypromellose 2.5 % ophthalmic solution Commonly known as: ISOPTO TEARS / GONIOVISC 1 drop.   ketoconazole 2 % cream Commonly known as: NIZORAL Apply 1 application topically daily.   lisinopril 40 MG tablet Commonly known as: ZESTRIL Take 20 mg by mouth daily. Take 1/2 tablet daily   mometasone 50 MCG/ACT nasal spray Commonly known as: NASONEX USE TWO SPRAY(S) IN EACH NOSTRIL TWICE DAILY   pravastatin 20 MG tablet Commonly known as: PRAVACHOL Take 20 mg by mouth daily.   terbinafine 1 % cream Commonly  known as: LAMISIL Apply 1 application topically 2 (two) times daily.   Vitamin D (Cholecalciferol) 50 MCG (2000 UT) Caps Take by mouth daily.        Objective:   BP 138/65   Pulse 66   Temp 97.9 F (36.6 C)   Ht _0  (1.727 m)   Wt 207 lb (93.9 kg)   SpO2 98%   BMI 31.47 kg/m   Wt Readings from Last 3 Encounters:  06/18/20 207 lb (93.9 kg)  04/19/20 (!) 208 lb (94.3 kg)  12/15/19 206 lb (93.4 kg)    Physical Exam Vitals and nursing  note reviewed.  Constitutional:      General: He is not in acute distress.    Appearance: He is well-developed. He is not diaphoretic.  Eyes:     General: No scleral icterus.    Conjunctiva/sclera: Conjunctivae normal.  Neck:     Thyroid: No thyromegaly.  Cardiovascular:     Rate and Rhythm: Normal rate and regular rhythm.     Heart sounds: Normal heart sounds. No murmur heard.   Pulmonary:     Effort: Pulmonary effort is normal. No respiratory distress.     Breath sounds: Normal breath sounds. No wheezing.  Musculoskeletal:        General: Normal range of motion.     Cervical back: Neck supple.  Lymphadenopathy:     Cervical: No cervical adenopathy.  Skin:    General: Skin is warm and dry.     Findings: No rash.  Neurological:     Mental Status: He is alert and oriented to person, place, and time.     Coordination: Coordination normal.  Psychiatric:        Behavior: Behavior normal.       Assessment & Plan:   Problem List Items Addressed This Visit      Cardiovascular and Mediastinum   HTN (hypertension), benign (Chronic)   Relevant Orders   CBC with Differential/Platelet   CMP14+EGFR     Other   Hypercholesterolemia - Primary (Chronic)   Relevant Orders   Lipid panel   Prediabetes    Other Visit Diagnoses    Flu vaccine need       Relevant Orders   Flu Vaccine QUAD High Dose(Fluad) (Completed)      Continue current medication, mostly sees the [place] for his management, no changes, he said he had blood work done just recently and he will bring in the results or copy of the results to [place]. Follow up plan: Return in about 1 year (around 06/18/2021), or if symptoms worsen or fail to improve.  Counseling provided for all of the vaccine components Orders Placed This Encounter  Procedures  . Flu Vaccine QUAD High Dose(Fluad)  . CBC with Differential/Platelet  . CMP14+EGFR  . Lipid panel    Caryl Pina, MD [facility] 06/18/2020,  9:59 AM

## 2020-11-27 ENCOUNTER — Ambulatory Visit (INDEPENDENT_AMBULATORY_CARE_PROVIDER_SITE_OTHER): Payer: Medicare Other | Admitting: Family Medicine

## 2020-11-27 DIAGNOSIS — J310 Chronic rhinitis: Secondary | ICD-10-CM

## 2020-11-27 DIAGNOSIS — J329 Chronic sinusitis, unspecified: Secondary | ICD-10-CM

## 2020-11-27 MED ORDER — AMOXICILLIN-POT CLAVULANATE 875-125 MG PO TABS: 1.0000 | ORAL_TABLET | Freq: Two times a day (BID) | ORAL | 0 refills | Status: DC

## 2020-11-27 MED ORDER — LORATADINE 10 MG PO TABS: 10.0000 mg | ORAL_TABLET | Freq: Every day | ORAL | 11 refills | Status: DC

## 2020-11-27 MED ORDER — MOMETASONE FUROATE 50 MCG/ACT NA SUSP: 2.0000 | Freq: Every day | NASAL | 99 refills | Status: DC

## 2020-11-27 NOTE — Progress Notes (Signed)
Telephone visit  Subjective: NO:MVEHM PCP: Dettinger, Elige Radon, MD CNO:BSJGG Elbert Ewings Winograd is a 87 y.o. male calls for telephone consult today. Patient provides verbal consent for consult held via phone.  Due to COVID-19 pandemic this visit was conducted virtually. This visit type was conducted due to national recommendations for restrictions regarding the COVID-19 Pandemic (e.g. social distancing, sheltering in place) in an effort to limit this patient's exposure and mitigate transmission in our community. All issues noted in this document were discussed and addressed.  A physical exam was not performed with this format.   Location of patient: home Location of provider: [facility] Others present for call: none  1. Cough Patient reports that he has a sore throat, productive cough and feels lousy.  Symptoms onset 3 days ago.  His cough is improving.  He reports sinus fullness.  Phlegm is white.  He reports rhinorrhea.  Former smoker.  Daughter was sick with similar last week.  Not using any medications.  He has a humidifier but nothing else.  Symptoms are progressively worse.  He is vaccinated against COVID   ROS: Per HPI  Allergies  Allergen Reactions  . Flomax [Tamsulosin Hcl]    Past Medical History:  Diagnosis Date  . Arthritis   . Bowel obstruction (HCC) 2015  . Cataract   . Hyperlipidemia   . Hypertension   . Obesity   . Prostate enlargement     Current Outpatient Medications:  .  amLODipine (NORVASC) 2.5 MG tablet, Take 2.5 mg by mouth daily., Disp: , Rfl:  .  aspirin 81 MG tablet, Take 81 mg by mouth daily., Disp: , Rfl:  .  betamethasone dipropionate 0.05 % cream, Apply topically 2 (two) times daily., Disp: , Rfl:  .  hydrochlorothiazide (HYDRODIURIL) 25 MG tablet, Take 25 mg by mouth daily., Disp: , Rfl:  .  hydroxypropyl methylcellulose / hypromellose (ISOPTO TEARS / GONIOVISC) 2.5 % ophthalmic solution, 1 drop., Disp: , Rfl:  .  ketoconazole (NIZORAL) 2 % cream, Apply 1  application topically daily., Disp: , Rfl:  .  lisinopril (PRINIVIL,ZESTRIL) 40 MG tablet, Take 20 mg by mouth daily. Take 1/2 tablet daily , Disp: , Rfl:  .  mometasone (NASONEX) 50 MCG/ACT nasal spray, USE TWO SPRAY(S) IN EACH NOSTRIL TWICE DAILY, Disp: 17 g, Rfl: 5 .  Omega-3 Fatty Acids (FISH OIL) 1000 MG CPDR, Take by mouth., Disp: , Rfl:  .  pravastatin (PRAVACHOL) 20 MG tablet, Take 20 mg by mouth daily., Disp: , Rfl:  .  terbinafine (LAMISIL) 1 % cream, Apply 1 application topically 2 (two) times daily., Disp: , Rfl:  .  Vitamin D, Cholecalciferol, 50 MCG (2000 UT) CAPS, Take by mouth daily., Disp: , Rfl:   Assessment/ Plan: 87 y.o. male   Rhinosinusitis - Plan: mometasone (NASONEX) 50 MCG/ACT nasal spray, loratadine (CLARITIN) 10 MG tablet, amoxicillin-clavulanate (AUGMENTIN) 875-125 MG tablet  Likely viral.  We discussed use of Claritin, Nasonex.  I given him a pocket prescription for Augmentin and he knows not to use this unless symptoms do not improve in 3 days or abruptly worsen.  He voiced good understanding of home care instructions.  Use humidifier.  Gargle salt water if needed for sore throat.  Follow-up as needed  Start time: 12:46pm End time: 12:51pm  Total time spent on patient care (including telephone call/ virtual visit): 5 minutes  Rannie Craney Hulen Skains, DO [facility] [phone]

## 2021-06-04 ENCOUNTER — Ambulatory Visit (INDEPENDENT_AMBULATORY_CARE_PROVIDER_SITE_OTHER): Payer: Medicare Other

## 2021-06-04 VITALS — Ht 68.0 in | Wt 203.0 lb

## 2021-06-04 DIAGNOSIS — Z0000 Encounter for general adult medical examination without abnormal findings: Secondary | ICD-10-CM

## 2021-06-04 NOTE — Patient Instructions (Signed)
Miguel Hardy , Thank you for taking time to come for your Medicare Wellness Visit. I appreciate your ongoing commitment to your health goals. Please review the following plan we discussed and let me know if I can assist you in the future.   Screening recommendations/referrals: Colonoscopy: No longer required Recommended yearly ophthalmology/optometry visit for glaucoma screening and checkup Recommended yearly dental visit for hygiene and checkup  Vaccinations: Influenza vaccine: Done 06/18/2020 - Repeat annually  Pneumococcal vaccine: Done at [facility] - we don't have exact dates Tdap vaccine: Done 10/13/2013 - Repeat in 10 years Shingles vaccine: First dose done 04/25/2021, second dose due in December   Covid-19: Done 11/21/19 & 12/19/19 - bring [place] records of other vaccines please  Advanced directives: Please bring a copy of your health care power of attorney and living will to the office to be added to your chart at your convenience.   Conditions/risks identified: Aim for 30 minutes of exercise or brisk walking each day, drink 6-8 glasses of water and eat lots of fruits and vegetables.   Next appointment: Follow up in one year for your annual wellness visit.   Preventive Care 70 Years and Older, Male  Preventive care refers to lifestyle choices and visits with your health care provider that can promote health and wellness. What does preventive care include? A yearly physical exam. This is also called an annual well check. Dental exams once or twice a year. Routine eye exams. Ask your health care provider how often you should have your eyes checked. Personal lifestyle choices, including: Daily care of your teeth and gums. Regular physical activity. Eating a healthy diet. Avoiding tobacco and drug use. Limiting alcohol use. Practicing safe sex. Taking low doses of aspirin every day. Taking vitamin and mineral supplements as recommended by your health care provider. What happens during an annual  well check? The services and screenings done by your health care provider during your annual well check will depend on your age, overall health, lifestyle risk factors, and family history of disease. Counseling  Your health care provider may ask you questions about your: Alcohol use. Tobacco use. Drug use. Emotional well-being. Home and relationship well-being. Sexual activity. Eating habits. History of falls. Memory and ability to understand (cognition). Work and work Astronomer. Screening  You may have the following tests or measurements: Height, weight, and BMI. Blood pressure. Lipid and cholesterol levels. These may be checked every 5 years, or more frequently if you are over 26 years old. Skin check. Lung cancer screening. You may have this screening every year starting at age 54 if you have a 30-pack-year history of smoking and currently smoke or have quit within the past 15 years. Fecal occult blood test (FOBT) of the stool. You may have this test every year starting at age 7. Flexible sigmoidoscopy or colonoscopy. You may have a sigmoidoscopy every 5 years or a colonoscopy every 10 years starting at age 8. Prostate cancer screening. Recommendations will vary depending on your family history and other risks. Hepatitis C blood test. Hepatitis B blood test. Sexually transmitted disease (STD) testing. Diabetes screening. This is done by checking your blood sugar (glucose) after you have not eaten for a while (fasting). You may have this done every 1-3 years. Abdominal aortic aneurysm (AAA) screening. You may need this if you are a current or former smoker. Osteoporosis. You may be screened starting at age 16 if you are at high risk. Talk with your health care provider about your test results,  treatment options, and if necessary, the need for more tests. Vaccines  Your health care provider may recommend certain vaccines, such as: Influenza vaccine. This is recommended every  year. Tetanus, diphtheria, and acellular pertussis (Tdap, Td) vaccine. You may need a Td booster every 10 years. Zoster vaccine. You may need this after age 9. Pneumococcal 13-valent conjugate (PCV13) vaccine. One dose is recommended after age 29. Pneumococcal polysaccharide (PPSV23) vaccine. One dose is recommended after age 18. Talk to your health care provider about which screenings and vaccines you need and how often you need them. This information is not intended to replace advice given to you by your health care provider. Make sure you discuss any questions you have with your health care provider. Document Released: 10/05/2015 Document Revised: 05/28/2016 Document Reviewed: 07/10/2015 Elsevier Interactive Patient Education  2017 [employer].  Fall Prevention in the Home Falls can cause injuries. They can happen to people of all ages. There are many things you can do to make your home safe and to help prevent falls. What can I do on the outside of my home? Regularly fix the edges of walkways and driveways and fix any cracks. Remove anything that might make you trip as you walk through a door, such as a raised step or threshold. Trim any bushes or trees on the path to your home. Use bright outdoor lighting. Clear any walking paths of anything that might make someone trip, such as rocks or tools. Regularly check to see if handrails are loose or broken. Make sure that both sides of any steps have handrails. Any raised decks and porches should have guardrails on the edges. Have any leaves, snow, or ice cleared regularly. Use sand or salt on walking paths during winter. Clean up any spills in your garage right away. This includes oil or grease spills. What can I do in the bathroom? Use night lights. Install grab bars by the toilet and in the tub and shower. Do not use towel bars as grab bars. Use non-skid mats or decals in the tub or shower. If you need to sit down in the shower, use a  plastic, non-slip stool. Keep the floor dry. Clean up any water that spills on the floor as soon as it happens. Remove soap buildup in the tub or shower regularly. Attach bath mats securely with double-sided non-slip rug tape. Do not have throw rugs and other things on the floor that can make you trip. What can I do in the bedroom? Use night lights. Make sure that you have a light by your bed that is easy to reach. Do not use any sheets or blankets that are too big for your bed. They should not hang down onto the floor. Have a firm chair that has side arms. You can use this for support while you get dressed. Do not have throw rugs and other things on the floor that can make you trip. What can I do in the kitchen? Clean up any spills right away. Avoid walking on wet floors. Keep items that you use a lot in easy-to-reach places. If you need to reach something above you, use a strong step stool that has a grab bar. Keep electrical cords out of the way. Do not use floor polish or wax that makes floors slippery. If you must use wax, use non-skid floor wax. Do not have throw rugs and other things on the floor that can make you trip. What can I do with my stairs? Do  not leave any items on the stairs. Make sure that there are handrails on both sides of the stairs and use them. Fix handrails that are broken or loose. Make sure that handrails are as long as the stairways. Check any carpeting to make sure that it is firmly attached to the stairs. Fix any carpet that is loose or worn. Avoid having throw rugs at the top or bottom of the stairs. If you do have throw rugs, attach them to the floor with carpet tape. Make sure that you have a light switch at the top of the stairs and the bottom of the stairs. If you do not have them, ask someone to add them for you. What else can I do to help prevent falls? Wear shoes that: Do not have high heels. Have rubber bottoms. Are comfortable and fit you  well. Are closed at the toe. Do not wear sandals. If you use a stepladder: Make sure that it is fully opened. Do not climb a closed stepladder. Make sure that both sides of the stepladder are locked into place. Ask someone to hold it for you, if possible. Clearly mark and make sure that you can see: Any grab bars or handrails. First and last steps. Where the edge of each step is. Use tools that help you move around (mobility aids) if they are needed. These include: Canes. Walkers. Scooters. Crutches. Turn on the lights when you go into a dark area. Replace any light bulbs as soon as they burn out. Set up your furniture so you have a clear path. Avoid moving your furniture around. If any of your floors are uneven, fix them. If there are any pets around you, be aware of where they are. Review your medicines with your doctor. Some medicines can make you feel dizzy. This can increase your chance of falling. Ask your doctor what other things that you can do to help prevent falls. This information is not intended to replace advice given to you by your health care provider. Make sure you discuss any questions you have with your health care provider. Document Released: 07/05/2009 Document Revised: 02/14/2016 Document Reviewed: 10/13/2014 Elsevier Interactive Patient Education  2017 [employer].

## 2021-06-04 NOTE — Progress Notes (Signed)
Subjective:   Miguel Hardy is a 87 y.o. male who presents for Medicare Annual/Subsequent preventive examination.  Virtual Visit via Telephone Note  I connected with  Miguel Hardy on 06/04/21 at 12:00 PM EDT by telephone and verified that I am speaking with the correct person using two identifiers.  Location: Patient: Home Provider: [facility] Persons participating in the virtual visit: patient/Nurse Health Advisor   I discussed the limitations, risks, security and privacy concerns of performing an evaluation and management service by telephone and the availability of in person appointments. The patient expressed understanding and agreed to proceed.  Interactive audio and video telecommunications were attempted between this nurse and patient, however failed, due to patient having technical difficulties OR patient did not have access to video capability.  We continued and completed visit with audio only.  Some vital signs may be absent or patient reported.   Miguel Drum E Rosalio Catterton, LPN   Review of Systems     Cardiac Risk Factors include: advanced age (>87men, >11 women);male gender;dyslipidemia;obesity (BMI >30kg/m2);sedentary lifestyle;hypertension;Other (see comment), Risk factor comments: atherosclerosis, Pre-diabetes     Objective:    Today's Vitals   06/04/21 1222  Weight: 203 lb (92.1 kg)  Height: 5\' 8"  (1.727 m)   Body mass index is 30.87 kg/m.  Advanced Directives 06/04/2021 12/20/2019 03/22/2019 09/22/2013  Does Patient Have a Medical Advance Directive? Yes Yes Yes Patient has advance directive, copy not in chart  Type of Advance Directive Healthcare Power of [place];Living will Living will Living will Living will  Does patient want to make changes to medical advance directive? - No - Patient declined No - Patient declined No  Copy of Healthcare Power of Attorney in Chart? No - copy requested - - Copy requested from family  Would patient like information on creating a medical  advance directive? - - No - Patient declined -  Pre-existing out of facility DNR order (yellow form or pink MOST form) - - - No    Current Medications (verified) Outpatient Encounter Medications as of 06/04/2021  Medication Sig   aspirin 81 MG tablet Take 81 mg by mouth daily.   finasteride (PROPECIA) 1 MG tablet Take 1 mg by mouth daily.   gabapentin (NEURONTIN) 100 MG capsule Take 100 mg by mouth 3 (three) times daily.   lisinopril-hydrochlorothiazide (ZESTORETIC) 20-25 MG tablet Take 0.5 tablets by mouth in the morning and at bedtime.   Omega-3 Fatty Acids (FISH OIL) 1000 MG CPDR Take by mouth.   pravastatin (PRAVACHOL) 20 MG tablet Take 20 mg by mouth daily.   amLODipine (NORVASC) 2.5 MG tablet Take 2.5 mg by mouth daily. (Patient not taking: Reported on 06/04/2021)   amoxicillin-clavulanate (AUGMENTIN) 875-125 MG tablet Take 1 tablet by mouth 2 (two) times daily. Use ONLY if symptoms are NO better in 3 days. (Patient not taking: Reported on 06/04/2021)   betamethasone dipropionate 0.05 % cream Apply topically 2 (two) times daily. (Patient not taking: Reported on 06/04/2021)   hydrochlorothiazide (HYDRODIURIL) 25 MG tablet Take 25 mg by mouth daily. (Patient not taking: Reported on 06/04/2021)   hydroxypropyl methylcellulose / hypromellose (ISOPTO TEARS / GONIOVISC) 2.5 % ophthalmic solution 1 drop. (Patient not taking: Reported on 06/04/2021)   ketoconazole (NIZORAL) 2 % cream Apply 1 application topically daily. (Patient not taking: Reported on 06/04/2021)   lisinopril (PRINIVIL,ZESTRIL) 40 MG tablet Take 20 mg by mouth daily. Take 1/2 tablet daily  (Patient not taking: Reported on 06/04/2021)   loratadine (CLARITIN) 10 MG tablet  Take 1 tablet (10 mg total) by mouth daily. (Patient not taking: Reported on 06/04/2021)   mometasone (NASONEX) 50 MCG/ACT nasal spray Place 2 sprays into the nose daily. (for sinus congestion) (Patient not taking: Reported on 06/04/2021)   terbinafine (LAMISIL) 1 % cream  Apply 1 application topically 2 (two) times daily. (Patient not taking: Reported on 06/04/2021)   Vitamin D, Cholecalciferol, 50 MCG (2000 UT) CAPS Take by mouth daily. (Patient not taking: Reported on 06/04/2021)   No facility-administered encounter medications on file as of 06/04/2021.    Allergies (verified) Flomax [tamsulosin hcl]   History: Past Medical History:  Diagnosis Date   Arthritis    Bowel obstruction (HCC) 2015   Cataract    Hyperlipidemia    Hypertension    Obesity    Prostate enlargement    Past Surgical History:  Procedure Laterality Date   APPENDECTOMY     CATARACT EXTRACTION, BILATERAL  03/23/2015   EYE SURGERY     REPLACEMENT TOTAL KNEE Right    Family History  Problem Relation Age of Onset   Heart disease Mother    Stroke Father    Alcohol abuse Brother    Social History   Socioeconomic History   Marital status: Widowed    Spouse name: Nellie   Number of children: 3   Years of education: 1 year of college   Highest education level: Some college, no degree  Occupational History   Occupation: Retired  Tobacco Use   Smoking status: Former    Packs/day: 1.00    Years: 40.00    Pack years: 40.00    Types: Cigarettes    Start date: 02/08/1948    Quit date: 09/22/1974    Years since quitting: 46.7   Smokeless tobacco: Never  Vaping Use   Vaping Use: Never used  Substance and Sexual Activity   Alcohol use: No   Drug use: No   Sexual activity: Not Currently  Other Topics Concern   Not on file  Social History Narrative   Lives alone   Daughter lives nearby   He gets most preventive care through [place] in [place]   Social Determinants of Health   Financial Resource Strain: Low Risk    Difficulty of Paying Living Expenses: Not hard at all  Food Insecurity: No Food Insecurity   Worried About Programme researcher, broadcasting/film/video in the Last Year: Never true   Barista in the Last Year: Never true  Transportation Needs: No Transportation Needs   Lack  of Transportation (Medical): No   Lack of Transportation (Non-Medical): No  Physical Activity: Insufficiently Active   Days of Exercise per Week: 7 days   Minutes of Exercise per Session: 20 min  Stress: No Stress Concern Present   Feeling of Stress : Not at all  Social Connections: Moderately Integrated   Frequency of Communication with Friends and Family: More than three times a week   Frequency of Social Gatherings with Friends and Family: More than three times a week   Attends Religious Services: More than 4 times per year   Active Member of [employer] or Organizations: Yes   Attends Banker Meetings: More than 4 times per year   Marital Status: Widowed    Tobacco Counseling Counseling given: Not Answered   Clinical Intake:  Pre-visit preparation completed: Yes  Pain : No/denies pain     BMI - recorded: 30.87 Nutritional Status: BMI > 30  Obese Nutritional Risks: Nausea/ vomitting/ diarrhea  How often do you need to have someone help you when you read instructions, pamphlets, or other written materials from your doctor or pharmacy?: 1 - Never  Diabetic? No  Interpreter Needed?: No  Information entered by :: Bardia Wangerin, LPN   Activities of Daily Living In your present state of health, do you have any difficulty performing the following activities: 06/04/2021  Hearing? Y  Vision? N  Difficulty concentrating or making decisions? Y  Comment a little  Walking or climbing stairs? Y  Dressing or bathing? N  Doing errands, shopping? N  Preparing Food and eating ? N  Using the Toilet? N  In the past six months, have you accidently leaked urine? Y  Comment sometimes  Do you have problems with loss of bowel control? N  Managing your Medications? N  Managing your Finances? N  Housekeeping or managing your Housekeeping? N  Some recent data might be hidden    Patient Care Team: Dettinger, Elige Radon, MD as PCP - General (Family Medicine) Gwenith Daily, RN  as Registered Nurse  Indicate any recent Medical Services you may have received from other than [facility] providers in the past year (date may be approximate).     Assessment:   This is a routine wellness examination for [place].  Hearing/Vision screen Hearing Screening - Comments:: Wears hearing aids - gets annual audiology exams [facility] Vision Screening - Comments:: Wears eyeglasses - [facility]  Dietary issues and exercise activities discussed: Current Exercise Habits: Home exercise routine, Type of exercise: walking, Time (Minutes): 20, Frequency (Times/Week): 7, Weekly Exercise (Minutes/Week): 140, Intensity: Mild, Exercise limited by: orthopedic condition(s);cardiac condition(s)   Goals Addressed             This Visit's Progress    Patient Stated   On track    12/20/2019 AWV Goal: Fall Prevention  Over the next year, patient will decrease their risk for falls by: Using assistive devices, such as a cane or walker, as needed Identifying fall risks within their home and correcting them by: Removing throw rugs Adding handrails to stairs or ramps Removing clutter and keeping a clear pathway throughout the home Increasing light, especially at night Adding shower handles/bars Raising toilet seat Identifying potential personal risk factors for falls: Medication side effects Incontinence/urgency Vestibular dysfunction Hearing loss Musculoskeletal disorders Neurological disorders Orthostatic hypotension         Depression Screen PHQ 2/9 Scores 06/04/2021 06/18/2020 04/19/2020 12/20/2019 12/15/2019 06/28/2018 09/23/2016  PHQ - 2 Score 0 0 0 0 0 0 0    Fall Risk Fall Risk  06/04/2021 06/18/2020 04/19/2020 12/20/2019 12/15/2019  Falls in the past year? 0 0 0 1 1  Number falls in past yr: 0 - - 0 0  Injury with Fall? 0 - - 0 0  Risk for fall due to : Impaired balance/gait;Impaired vision;Orthopedic patient - - - -  Follow up Education provided;Falls  prevention discussed - - - -    FALL RISK PREVENTION PERTAINING TO THE HOME:  Any stairs in or around the home? No  If so, are there any without handrails? No  Home free of loose throw rugs in walkways, pet beds, electrical cords, etc? Yes  Adequate lighting in your home to reduce risk of falls? Yes   ASSISTIVE DEVICES UTILIZED TO PREVENT FALLS:  Life alert? No  Use of a cane, walker or w/c? No  Grab bars in the bathroom? Yes  Shower chair or  bench in shower? Yes  Elevated toilet seat or a handicapped toilet? Yes   TIMED UP AND GO:  Was the test performed? No . Telephonic visit  Cognitive Function:     6CIT Screen 06/04/2021 12/20/2019  What Year? 0 points 0 points  What month? 0 points 0 points  What time? 0 points 0 points  Count back from 20 0 points 0 points  Months in reverse 0 points 0 points  Repeat phrase 4 points 4 points  Total Score 4 -    Immunizations Immunization History  Administered Date(s) Administered   Fluad Quad(high Dose 65+) 06/18/2020   Influenza-Unspecified 10/04/2013, 06/22/2018, 10/15/2018, 10/15/2018   Moderna Sars-Covid-2 Vaccination 11/21/2019, 12/19/2019   Pneumococcal Conjugate-13 09/22/2014   Tdap 10/13/2013   Zoster Recombinat (Shingrix) 04/25/2021    TDAP status: Up to date  Flu Vaccine status: Up to date  Pneumococcal vaccine status: Up to date  Covid-19 vaccine status: Completed vaccines  Qualifies for Shingles Vaccine? Yes   Zostavax completed Yes   Shingrix Completed?: No.    Education has been provided regarding the importance of this vaccine. Patient has been advised to call insurance company to determine out of pocket expense if they have not yet received this vaccine. Advised may also receive vaccine at local pharmacy or Health Dept. Verbalized acceptance and understanding.  Screening Tests Health Maintenance  Topic Date Due   COVID-19 Vaccine (3 - Booster for Moderna series) 05/20/2020   INFLUENZA VACCINE   04/22/2021   Zoster Vaccines- Shingrix (2 of 2) 06/20/2021   TETANUS/TDAP  10/14/2023   PNA vac Low Risk Adult  Addressed   HPV VACCINES  Aged Out    Health Maintenance  Health Maintenance Due  Topic Date Due   COVID-19 Vaccine (3 - Booster for Moderna series) 05/20/2020   INFLUENZA VACCINE  04/22/2021    Colorectal cancer screening: No longer required.   Lung Cancer Screening: (Low Dose CT Chest recommended if Age 42-80 years, 30 pack-year currently smoking OR have quit w/in 15years.) does not qualify.   Additional Screening:  Hepatitis C Screening: does not qualify  Vision Screening: Recommended annual ophthalmology exams for early detection of glaucoma and other disorders of the eye. Is the patient up to date with their annual eye exam?  Yes  Who is the provider or what is the name of the office in which the patient attends annual eye exams? [facility] If pt is not established with a provider, would they like to be referred to a provider to establish care? No .   Dental Screening: Recommended annual dental exams for proper oral hygiene  Community Resource Referral / Chronic Care Management: CRR required this visit?  No   CCM required this visit?  No      Plan:     I have personally reviewed and noted the following in the patient's chart:   Medical and social history Use of alcohol, tobacco or illicit drugs  Current medications and supplements including opioid prescriptions. Patient is not currently taking opioid prescriptions. Functional ability and status Nutritional status Physical activity Advanced directives List of other physicians Hospitalizations, surgeries, and ER visits in previous 12 months Vitals Screenings to include cognitive, depression, and falls Referrals and appointments  In addition, I have reviewed and discussed with patient certain preventive protocols, quality metrics, and best practice recommendations. A written personalized care  plan for preventive services as well as general preventive health recommendations were provided to patient.     Ernesta Trabert  E Winn Muehl, LPN   [id number]   Nurse Notes: [facility] to [facility] to be [facility] into chart so that we may update vaccines and labs.

## 2021-09-17 ENCOUNTER — Other Ambulatory Visit: Payer: Self-pay

## 2021-09-17 ENCOUNTER — Emergency Department (HOSPITAL_COMMUNITY)
Admission: EM | Admit: 2021-09-17 | Discharge: 2021-09-18 | Disposition: A | Discharge status: Home / Self Care | Payer: Medicare Other | Attending: Emergency Medicine | Admitting: Emergency Medicine

## 2021-09-17 ENCOUNTER — Emergency Department (HOSPITAL_COMMUNITY): Payer: Medicare Other

## 2021-09-17 DIAGNOSIS — Z96651 Presence of right artificial knee joint: Secondary | ICD-10-CM | POA: Insufficient documentation

## 2021-09-17 DIAGNOSIS — Z87891 Personal history of nicotine dependence: Secondary | ICD-10-CM | POA: Insufficient documentation

## 2021-09-17 DIAGNOSIS — I517 Cardiomegaly: Secondary | ICD-10-CM | POA: Diagnosis not present

## 2021-09-17 DIAGNOSIS — R0789 Other chest pain: Secondary | ICD-10-CM | POA: Diagnosis present

## 2021-09-17 DIAGNOSIS — I10 Essential (primary) hypertension: Secondary | ICD-10-CM | POA: Insufficient documentation

## 2021-09-17 DIAGNOSIS — Z79899 Other long term (current) drug therapy: Secondary | ICD-10-CM | POA: Diagnosis not present

## 2021-09-17 DIAGNOSIS — Z7982 Long term (current) use of aspirin: Secondary | ICD-10-CM | POA: Insufficient documentation

## 2021-09-17 DIAGNOSIS — R079 Chest pain, unspecified: Secondary | ICD-10-CM | POA: Diagnosis not present

## 2021-09-17 LAB — BASIC METABOLIC PANEL
Anion gap: 9 (ref 5–15)
BUN: 35 mg/dL — ABNORMAL HIGH (ref 8–23)
CO2: 27 mmol/L (ref 22–32)
Calcium: 9.1 mg/dL (ref 8.9–10.3)
Chloride: 101 mmol/L (ref 98–111)
Creatinine, Ser: 1.39 mg/dL — ABNORMAL HIGH (ref 0.61–1.24)
GFR, Estimated: 48 mL/min — ABNORMAL LOW (ref >60)
Glucose, Bld: 112 mg/dL — ABNORMAL HIGH (ref 70–99)
Potassium: 4 mmol/L (ref 3.5–5.1)
Sodium: 137 mmol/L (ref 135–145)

## 2021-09-17 LAB — CBC
HCT: 38.7 % — ABNORMAL LOW (ref 39.0–52.0)
Hemoglobin: 12.9 g/dL — ABNORMAL LOW (ref 13.0–17.0)
MCH: 31.2 pg (ref 26.0–34.0)
MCHC: 33.3 g/dL (ref 30.0–36.0)
MCV: 93.5 fL (ref 80.0–100.0)
Platelets: 304 10*3/uL (ref 150–400)
RBC: 4.14 MIL/uL — ABNORMAL LOW (ref 4.22–5.81)
RDW: 13.5 % (ref 11.5–15.5)
WBC: 7 10*3/uL (ref 4.0–10.5)
nRBC: 0 % (ref 0.0–0.2)

## 2021-09-17 LAB — TROPONIN I (HIGH SENSITIVITY): Troponin I (High Sensitivity): 9 ng/L (ref <18)

## 2021-09-17 NOTE — ED Provider Notes (Signed)
[facility] EMERGENCY DEPARTMENT Provider Note   CSN: [id number] Arrival date & time: 09/17/21  2109     History Chief Complaint  Patient presents with   Chest Pain    Miguel Hardy is a 87 y.o. male.  87 year old male who presents to the emergency department with concerns of chest discomfort.  Patient states that for the last 5 to 6 months he has had intermittent episodes of left-sided chest discomfort.  He states he does not really hurt just a short pressure the last for few seconds and then goes away just as quickly as it came.  He has no associated dyspnea, diaphoresis, lightheadedness, radiation or other associated symptoms.  No recent fever or cough.  He had episode again today so wanted to get checked out.  States he last had a stress test 4 years ago which was okay.  Has not had any cardiac issues that he knows of otherwise.  States has been compliant with his blood pressure medications and other medications as well.  No medication changes recently.   Chest Pain     Past Medical History:  Diagnosis Date   Arthritis    Bowel obstruction (HCC) 2015   Cataract    Hyperlipidemia    Hypertension    Obesity    Prostate enlargement     Patient Active Problem List   Diagnosis Date Noted   Prediabetes 12/15/2019   Diverticulitis of large intestine without perforation or abscess without bleeding 03/25/2019   Pedal edema 10/04/2013   Ulcers of both lower legs, limited to breakdown of skin (HCC) 10/04/2013   Obesity    Small bowel ischemia (HCC) 09/22/2013   HTN (hypertension), benign 09/21/2013   Hypercholesterolemia 09/21/2013   Regular astigmatism 07/13/2013   Senile nuclear sclerosis 07/01/2012   Hypermetropia 07/01/2012   Presbyopia 07/01/2012   PVD (posterior vitreous detachment) 07/01/2012    Past Surgical History:  Procedure Laterality Date   APPENDECTOMY     CATARACT EXTRACTION, BILATERAL  03/23/2015   EYE SURGERY     REPLACEMENT TOTAL KNEE Right         Family History  Problem Relation Age of Onset   Heart disease Mother    Stroke Father    Alcohol abuse Brother     Social History   Tobacco Use   Smoking status: Former    Packs/day: 1.00    Years: 40.00    Pack years: 40.00    Types: Cigarettes    Start date: 02/08/1948    Quit date: 09/22/1974    Years since quitting: 47.0   Smokeless tobacco: Never  Vaping Use   Vaping Use: Never used  Substance Use Topics   Alcohol use: No   Drug use: No    Home Medications Prior to Admission medications   Medication Sig Start Date End Date Taking? Authorizing Provider  aspirin 81 MG tablet Take 81 mg by mouth daily.    [provider]  finasteride (PROPECIA) 1 MG tablet Take 1 mg by mouth daily.    [provider]  gabapentin (NEURONTIN) 100 MG capsule Take 100 mg by mouth 3 (three) times daily.    [provider]  lisinopril-hydrochlorothiazide (ZESTORETIC) 20-25 MG tablet Take 0.5 tablets by mouth in the morning and at bedtime.    [provider]  Omega-3 Fatty Acids (FISH OIL) 1000 MG CPDR Take by mouth.    [provider]  pravastatin (PRAVACHOL) 20 MG tablet Take 20 mg by mouth daily.  [provider]    Allergies    Flomax [tamsulosin hcl]  Review of Systems   Review of Systems  Cardiovascular:  Positive for chest pain.  All other systems reviewed and are negative.  Physical Exam Updated Vital Signs BP (!) 174/65 (BP Location: Right Arm)    Pulse 66    Temp 98.2 F (36.8 C) (Oral)    Resp 20    SpO2 100%   Physical Exam Vitals and nursing note reviewed.  Constitutional:      Appearance: He is well-developed.  HENT:     Head: Normocephalic and atraumatic.  Cardiovascular:     Rate and Rhythm: Normal rate.  Pulmonary:     Effort: Pulmonary effort is normal. No respiratory distress.     Breath sounds: Wheezing (mild RUL, intermittent) present. No decreased breath sounds or rales.  Chest:     Chest  wall: No mass or tenderness.  Abdominal:     General: There is no distension.     Palpations: Abdomen is soft.  Musculoskeletal:        General: Normal range of motion.     Cervical back: Normal range of motion.  Neurological:     Mental Status: He is alert.    ED Results / Procedures / Treatments   Labs (all labs ordered are listed, but only abnormal results are displayed) Labs Reviewed  BASIC METABOLIC PANEL - Abnormal; Notable for the following components:      Result Value   Glucose, Bld 112 (*)    BUN 35 (*)    Creatinine, Ser 1.39 (*)    GFR, Estimated 48 (*)    All other components within normal limits  CBC - Abnormal; Notable for the following components:   RBC 4.14 (*)    Hemoglobin 12.9 (*)    HCT 38.7 (*)    All other components within normal limits  TROPONIN I (HIGH SENSITIVITY)  TROPONIN I (HIGH SENSITIVITY)    EKG EKG Interpretation  Date/Time:  Tuesday September 17 2021 21:46:08 EST Ventricular Rate:  60 PR Interval:  150 QRS Duration: 82 QT Interval:  382 QTC Calculation: 382 R Axis:   22 Text Interpretation: Sinus rhythm with Premature atrial complexes Otherwise normal ECG Confirmed by Marily Memos [phone]) on 09/17/2021 11:03:29 PM  Radiology DG Chest 2 View  Result Date: 09/17/2021 CLINICAL DATA:  Chest pain EXAM: CHEST - 2 VIEW COMPARISON:  07/11/2013 FINDINGS: Mild cardiomegaly. No focal opacity or pleural effusion. No pneumothorax. Aortic atherosclerosis. IMPRESSION: No active cardiopulmonary disease.  Mild cardiomegaly Electronically Signed   By: Jasmine Pang M.D.   On: 09/17/2021 23:03    Procedures Procedures   Medications Ordered in ED Medications - No data to display  ED Course  I have reviewed the triage vital signs and the nursing notes.  Pertinent labs & imaging results that were available during my care of the patient were reviewed by me and considered in my medical decision making (see chart for details).    MDM  Rules/Calculators/A&P                         Unclear etiology for symptoms.  Has been long lasting for over 6 months but not consistent with exertion, food, position or other obvious inciting factors to suggest a cause for symptoms.  Could be vasospasm however that would be hard to diagnose in the emergency room.  Does not seem like ACS, pneumonia, blood clot or  other obvious emergent etiologies.  We will get a second troponin just to ensure that there is no leak for the first 1 and his EKG are very reassuring.  Blood pressure slightly elevated however it sounds like he normally runs in the 160s.  Will defer to PCP for further management of blood pressure we will just ensure there is no emergent condition going on tonight. Workup ok, no e/o ACS. Cards follow up.    Final Clinical Impression(s) / ED Diagnoses Final diagnoses:  Hypertension, unspecified type    Rx / DC Orders ED Discharge Orders     None        Javon Snee, Barbara Cower, MD 09/18/21 [phone]

## 2021-09-17 NOTE — ED Triage Notes (Signed)
Pt arrives with c/o chest pains for about 2 days. Pt denies SOB, n/v, or any illnesses.

## 2021-09-18 LAB — TROPONIN I (HIGH SENSITIVITY): Troponin I (High Sensitivity): 10 ng/L (ref <18)

## 2021-10-31 ENCOUNTER — Ambulatory Visit (INDEPENDENT_AMBULATORY_CARE_PROVIDER_SITE_OTHER): Payer: Medicare Other | Admitting: Family Medicine

## 2021-10-31 ENCOUNTER — Encounter: Payer: Self-pay | Admitting: Family Medicine

## 2021-10-31 VITALS — BP 164/68 | HR 72 | Temp 98.0°F | Ht 68.0 in | Wt 207.2 lb

## 2021-10-31 DIAGNOSIS — J069 Acute upper respiratory infection, unspecified: Secondary | ICD-10-CM

## 2021-10-31 DIAGNOSIS — R051 Acute cough: Secondary | ICD-10-CM

## 2021-10-31 MED ORDER — BENZONATATE 100 MG PO CAPS
100.0000 mg | ORAL_CAPSULE | Freq: Three times a day (TID) | ORAL | 0 refills | Status: DC | PRN
Start: 2021-10-31 — End: 2022-01-17

## 2021-10-31 MED ORDER — GUAIFENESIN ER 600 MG PO TB12
600.0000 mg | ORAL_TABLET | Freq: Two times a day (BID) | ORAL | 0 refills | Status: DC
Start: 2021-10-31 — End: 2022-05-21

## 2021-10-31 NOTE — Progress Notes (Signed)
Assessment & Plan:  1. Viral URI Discussed symptom management and typical course of viral illnesses.  Advised if he is not feeling better by 7 to 10 days after onset of symptoms he needs to let us know.  Education provided on viral respiratory infections.  Patient declined testing for influenza and COVID. - guaiFENesin (MUCINEX) 600 MG 12 hr tablet; Take 1 tablet (600 mg total) by mouth 2 (two) times daily.  Dispense: 60 tablet; Refill: 0 - benzonatate (TESSALON PERLES) 100 MG capsule; Take 1 capsule (100 mg total) by mouth 3 (three) times daily as needed for cough.  Dispense: 30 capsule; Refill: 0   Follow up plan: Return if symptoms worsen or fail to improve.  Deliah Boston, MSN, APRN, FNP-C [facility]  Subjective:   Patient ID: Miguel Hardy, male    DOB: [birth year], 88 y.o.   MRN: [id number]  HPI: Miguel Hardy is a 88 y.o. male presenting on 10/31/2021 for Headache, Cough, and Nasal Congestion (X 2 days)  Patient complains of cough, head/chest congestion, headache, runny nose, fever, and wheezing. Onset of symptoms was 2 days ago, gradually improving since that time. He is drinking moderate amounts of fluids. Evaluation to date: none. Treatment to date:  Albuterol and aspirin . He does not smoke.    ROS: Negative unless specifically indicated above in HPI.   Relevant past medical history reviewed and updated as indicated.   Allergies and medications reviewed and updated.   Current Outpatient Medications:    aspirin 81 MG tablet, Take 81 mg by mouth daily., Disp: , Rfl:    finasteride (PROPECIA) 1 MG tablet, Take 1 mg by mouth daily., Disp: , Rfl:    gabapentin (NEURONTIN) 100 MG capsule, Take 100 mg by mouth 3 (three) times daily., Disp: , Rfl:    lisinopril-hydrochlorothiazide (ZESTORETIC) 20-25 MG tablet, Take 0.5 tablets by mouth in the morning and at bedtime., Disp: , Rfl:    Omega-3 Fatty Acids (FISH OIL) 1000 MG CPDR, Take by mouth., Disp: , Rfl:     pravastatin (PRAVACHOL) 20 MG tablet, Take 20 mg by mouth daily., Disp: , Rfl:   Allergies  Allergen Reactions   Flomax [Tamsulosin Hcl]     Objective:   BP (!) 164/68    Pulse 72    Temp 98 F (36.7 C) (Temporal)    Ht 5\' 8"  (1.727 m)    Wt 207 lb 3.2 oz (94 kg)    SpO2 97%    BMI 31.50 kg/m    Physical Exam Vitals reviewed.  Constitutional:      General: He is not in acute distress.    Appearance: Normal appearance. He is not ill-appearing, toxic-appearing or diaphoretic.  HENT:     Head: Normocephalic and atraumatic.     Right Ear: Tympanic membrane, ear canal and external ear normal. There is no impacted cerumen.     Left Ear: Tympanic membrane, ear canal and external ear normal. There is no impacted cerumen.     Nose: Congestion present. No rhinorrhea.     Mouth/Throat:     Mouth: Mucous membranes are moist.     Pharynx: Oropharynx is clear. No oropharyngeal exudate or posterior oropharyngeal erythema.  Eyes:     General: No scleral icterus.       Right eye: No discharge.        Left eye: No discharge.     Conjunctiva/sclera: Conjunctivae normal.  Cardiovascular:     Rate and Rhythm:  Normal rate and regular rhythm.     Heart sounds: Normal heart sounds. No murmur heard.   No friction rub. No gallop.  Pulmonary:     Effort: Pulmonary effort is normal. No respiratory distress.     Breath sounds: Normal breath sounds. No stridor. No wheezing, rhonchi or rales.  Musculoskeletal:        General: Normal range of motion.     Cervical back: Normal range of motion.  Lymphadenopathy:     Cervical: No cervical adenopathy.  Skin:    General: Skin is warm and dry.  Neurological:     Mental Status: He is alert and oriented to person, place, and time. Mental status is at baseline.  Psychiatric:        Mood and Affect: Mood normal.        Behavior: Behavior normal.        Thought Content: Thought content normal.        Judgment: Judgment normal.

## 2021-11-03 ENCOUNTER — Encounter: Payer: Self-pay | Admitting: Family Medicine

## 2021-11-05 ENCOUNTER — Ambulatory Visit (INDEPENDENT_AMBULATORY_CARE_PROVIDER_SITE_OTHER): Payer: Medicare Other | Admitting: Nurse Practitioner

## 2021-11-05 DIAGNOSIS — J069 Acute upper respiratory infection, unspecified: Secondary | ICD-10-CM | POA: Diagnosis not present

## 2021-11-05 MED ORDER — AMOXICILLIN-POT CLAVULANATE 875-125 MG PO TABS: 1.0000 | ORAL_TABLET | Freq: Two times a day (BID) | ORAL | 0 refills | Status: DC

## 2021-11-05 NOTE — Progress Notes (Signed)
Virtual Visit  Note Due to COVID-19 pandemic this visit was conducted virtually. This visit type was conducted due to national recommendations for restrictions regarding the COVID-19 Pandemic (e.g. social distancing, sheltering in place) in an effort to limit this patient's exposure and mitigate transmission in our community. All issues noted in this document were discussed and addressed.  A physical exam was not performed with this format.  I connected with Miguel Hardy on 11/05/21 at 1:28 by telephone and verified that I am speaking with the correct person using two identifiers. Miguel Hardy is currently located at home and no one is currently with him during visit. The provider, Mary-Margaret Daphine Deutscher, FNP is located in their office at time of visit.  I discussed the limitations, risks, security and privacy concerns of performing an evaluation and management service by telephone and the availability of in person appointments. I also discussed with the patient that there may be a patient responsible charge related to this service. The patient expressed understanding and agreed to proceed.   History and Present Illness:  URI  This is a new problem. The current episode started in the past 7 days. The problem has been waxing and waning. The maximum temperature recorded prior to his arrival was 100.4 - 100.9 F. The fever has been present for 1 to 2 days. Associated symptoms include congestion, coughing, headaches, rhinorrhea and a sore throat. He has tried acetaminophen and decongestant for the symptoms. The treatment provided mild relief. Covid test was negative. He was seen last Friday in office by B. Alona Bene.    Review of Systems  Constitutional:  Positive for chills, fever and malaise/fatigue.  HENT:  Positive for congestion, rhinorrhea and sore throat.   Respiratory:  Positive for cough.   Musculoskeletal:  Negative for myalgias.  Neurological:  Positive for headaches.     Observations/Objective: Alert and oriented- answers all questions appropriately No distress Raspy voice Dry cough   Assessment and Plan: Miguel Hardy in today with chief complaint of URI   1. URI with cough and congestion 1. Take meds as prescribed 2. Use a cool mist humidifier especially during the winter months and when heat has been humid. 3. Use saline nose sprays frequently 4. Saline irrigations of the nose can be very helpful if done frequently.  * 4X daily for 1 week*  * Use of a nettie pot can be helpful with this. Follow directions with this* 5. Drink plenty of fluids 6. Keep thermostat turn down low 7.For any cough or congestion- tessalon perles as previously prescribed 8. For fever or aces or pains- take tylenol or ibuprofen appropriate for age and weight.  * for fevers greater than 101 orally you may alternate ibuprofen and tylenol every  3 hours.   Meds ordered this encounter  Medications   amoxicillin-clavulanate (AUGMENTIN) 875-125 MG tablet    Sig: Take 1 tablet by mouth 2 (two) times daily.    Dispense:  14 tablet    Refill:  0    Order Specific Question:   Supervising Provider    Answer:   Arville Care A [[id number]]      Follow Up Instructions: Prn     I discussed the assessment and treatment plan with the patient. The patient was provided an opportunity to ask questions and all were answered. The patient agreed with the plan and demonstrated an understanding of the instructions.   The patient was advised to call back or seek an in-person evaluation if  the symptoms worsen or if the condition fails to improve as anticipated.  The above assessment and management plan was discussed with the patient. The patient verbalized understanding of and has agreed to the management plan. Patient is aware to call the clinic if symptoms persist or worsen. Patient is aware when to return to the clinic for a follow-up visit. Patient educated on when it is  appropriate to go to the emergency department.   Time call ended:  1:40  I provided 12 minutes of  non face-to-face time during this encounter.    Mary-Margaret Daphine Deutscher, FNP

## 2021-11-05 NOTE — Patient Instructions (Signed)

## 2022-01-17 ENCOUNTER — Encounter: Payer: Self-pay | Admitting: Family

## 2022-01-17 ENCOUNTER — Ambulatory Visit (INDEPENDENT_AMBULATORY_CARE_PROVIDER_SITE_OTHER): Payer: Medicare Other | Admitting: Family

## 2022-01-17 VITALS — BP 142/61 | HR 68 | Temp 97.3°F | Ht 68.0 in | Wt 210.4 lb

## 2022-01-17 DIAGNOSIS — J209 Acute bronchitis, unspecified: Secondary | ICD-10-CM

## 2022-01-17 MED ORDER — PREDNISONE 20 MG PO TABS
40.0000 mg | ORAL_TABLET | Freq: Every day | ORAL | 0 refills | Status: AC
Start: 2022-01-17 — End: 2022-01-22

## 2022-01-17 MED ORDER — ALBUTEROL SULFATE HFA 108 (90 BASE) MCG/ACT IN AERS: 2.0000 | INHALATION_SPRAY | Freq: Four times a day (QID) | RESPIRATORY_TRACT | 0 refills | Status: AC | PRN

## 2022-01-17 MED ORDER — CETIRIZINE HCL 10 MG PO TABS: 10.0000 mg | ORAL_TABLET | Freq: Every day | ORAL | 11 refills | Status: DC

## 2022-01-17 NOTE — Progress Notes (Addendum)
? ?Subjective:  ? ? Patient ID: Miguel Hardy, male    DOB: April 05, 1930, 88 y.o.   MRN: [id number] ? ?Chief Complaint  ?Patient presents with  ? Cough  ?  X 2 days- no other symptoms   ? ? ?Cough ?This is a new problem. The current episode started in the past 7 days. The problem has been gradually worsening. The problem occurs every few minutes. The cough is Non-productive. Associated symptoms include nasal congestion, postnasal drip and shortness of breath. Pertinent negatives include no chills, ear congestion, ear pain, fever, headaches, myalgias or wheezing. He has tried rest and OTC cough suppressant for the symptoms. The treatment provided mild relief.  ? ? ? ?Review of Systems  ?Constitutional:  Negative for chills and fever.  ?HENT:  Positive for postnasal drip. Negative for ear pain.   ?Respiratory:  Positive for cough and shortness of breath. Negative for wheezing.   ?Musculoskeletal:  Negative for myalgias.  ?Neurological:  Negative for headaches.  ?All other systems reviewed and are negative. ? ?   ?Objective:  ? Physical Exam ?Vitals reviewed.  ?Constitutional:   ?   General: He is not in acute distress. ?   Appearance: He is well-developed. He is obese.  ?HENT:  ?   Head: Normocephalic.  ?   Right Ear: Tympanic membrane normal.  ?   Left Ear: Tympanic membrane normal.  ?Eyes:  ?   General:     ?   Right eye: No discharge.     ?   Left eye: No discharge.  ?   Pupils: Pupils are equal, round, and reactive to light.  ?Neck:  ?   Thyroid: No thyromegaly.  ?Cardiovascular:  ?   Rate and Rhythm: Normal rate and regular rhythm.  ?   Heart sounds: Normal heart sounds. No murmur heard. ?Pulmonary:  ?   Effort: Pulmonary effort is normal. No respiratory distress.  ?   Breath sounds: Wheezing present.  ?Abdominal:  ?   General: Bowel sounds are normal. There is no distension.  ?   Palpations: Abdomen is soft.  ?   Tenderness: There is no abdominal tenderness.  ?Musculoskeletal:     ?   General: No tenderness. Normal  range of motion.  ?   Cervical back: Normal range of motion and neck supple.  ?Skin: ?   General: Skin is warm and dry.  ?   Findings: No erythema or rash.  ?Neurological:  ?   Mental Status: He is alert and oriented to person, place, and time.  ?   Cranial Nerves: No cranial nerve deficit.  ?   Deep Tendon Reflexes: Reflexes are normal and symmetric.  ?Psychiatric:     ?   Behavior: Behavior normal.     ?   Thought Content: Thought content normal.     ?   Judgment: Judgment normal.  ? ? ?BP (!) 142/61   Pulse 68   Temp (!) 97.3 ?F (36.3 ?C) (Temporal)   Ht 5\' 8"  (1.727 m)   Wt 210 lb 6.4 oz (95.4 kg)   SpO2 95%   BMI 31.99 kg/m?  ? ? ? ?   ?Assessment & Plan:  ?[name] comes in today with chief complaint of Cough (X 2 days- no other symptoms ) ? ? ?Diagnosis and orders addressed: ? ?1. Acute bronchitis, unspecified organism ?- Take meds as prescribed ?- Use a cool mist humidifier  ?-Use saline nose sprays frequently ?-Force fluids ?-For any  cough or congestion ? Use plain Mucinex- regular strength or max strength is fine ?-For fever or aces or pains- take tylenol or ibuprofen. ?-Throat lozenges if help ?-follow up if symptoms worsen or do not improve  ?- predniSONE (DELTASONE) 20 MG tablet; Take 2 tablets (40 mg total) by mouth daily with breakfast for 5 days.  Dispense: 10 tablet; Refill: 0 ?- albuterol (VENTOLIN HFA) 108 (90 Base) MCG/ACT inhaler; Inhale 2 puffs into the lungs every 6 (six) hours as needed for wheezing or shortness of breath.  Dispense: 8 g; Refill: 0 ?- cetirizine (ZYRTEC) 10 MG tablet; Take 1 tablet (10 mg total) by mouth daily.  Dispense: 30 tablet; Refill: 11 ? ? ?Miguel Dun, FNP ? ? ? ?

## 2022-01-17 NOTE — Patient Instructions (Signed)
Acute Bronchitis, Adult ? ?Acute bronchitis is sudden inflammation of the main airways (bronchi) that come off the windpipe (trachea) in the lungs. The swelling causes the airways to get smaller and make more mucus than normal. This can make it hard to breathe and can cause coughing or noisy breathing (wheezing). ?Acute bronchitis may last several weeks. The cough may last longer. Allergies, asthma, and exposure to smoke may make the condition worse. ?What are the causes? ?This condition can be caused by germs and by substances that irritate the lungs, including: ?Cold and flu viruses. The most common cause of this condition is the virus that causes the common cold. ?Bacteria. This is less common. ?Breathing in substances that irritate the lungs, including: ?Smoke from cigarettes and other forms of tobacco. ?Dust and pollen. ?Fumes from household cleaning products, gases, or burned fuel. ?Indoor or outdoor air pollution. ?What increases the risk? ?The following factors may make you more likely to develop this condition: ?A weak body's defense system, also called the immune system. ?A condition that affects your lungs and breathing, such as asthma. ?What are the signs or symptoms? ?Common symptoms of this condition include: ?Coughing. This may bring up clear, yellow, or green mucus from your lungs (sputum). ?Wheezing. ?Runny or stuffy nose. ?Having too much mucus in your lungs (chest congestion). ?Shortness of breath. ?Aches and pains, including sore throat or chest. ?How is this diagnosed? ?This condition is usually diagnosed based on: ?Your symptoms and medical history. ?A physical exam. ?You may also have other tests, including tests to rule out other conditions, such as pneumonia. These tests include: ?A test of lung function. ?Test of a mucus sample to look for the presence of bacteria. ?Tests to check the oxygen level in your blood. ?Blood tests. ?Chest X-ray. ?How is this treated? ?Most cases of acute  bronchitis clear up over time without treatment. Your health care provider may recommend: ?Drinking more fluids to help thin your mucus so it is easier to cough up. ?Taking inhaled medicine (inhaler) to improve air flow in and out of your lungs. ?Using a vaporizer or a humidifier. These are machines that add water to the air to help you breathe better. ?Taking a medicine that thins mucus and clears congestion (expectorant). ?Taking a medicine that prevents or stops coughing (cough suppressant). ?It is notcommon to take an antibiotic medicine for this condition. ?Follow these instructions at home: ? ?Take over-the-counter and prescription medicines only as told by your health care provider. ?Use an inhaler, vaporizer, or humidifier as told by your health care provider. ?Take two teaspoons (10 mL) of honey at bedtime to lessen coughing at night. ?Drink enough fluid to keep your urine pale yellow. ?Do not use any products that contain nicotine or tobacco. These products include cigarettes, chewing tobacco, and vaping devices, such as e-cigarettes. If you need help quitting, ask your health care provider. ?Get plenty of rest. ?Return to your normal activities as told by your health care provider. Ask your health care provider what activities are safe for you. ?Keep all follow-up visits. This is important. ?How is this prevented? ?To lower your risk of getting this condition again: ?Wash your hands often with soap and water for at least 20 seconds. If soap and water are not available, use hand sanitizer. ?Avoid contact with people who have cold symptoms. ?Try not to touch your mouth, nose, or eyes with your hands. ?Avoid breathing in smoke or chemical fumes. Breathing smoke or chemical fumes will make your   condition worse. ?Get the flu shot every year. ?Contact a health care provider if: ?Your symptoms do not improve after 2 weeks. ?You have trouble coughing up the mucus. ?Your cough keeps you awake at night. ?You have a  fever. ?Get help right away if you: ?Cough up blood. ?Feel pain in your chest. ?Have severe shortness of breath. ?Faint or keep feeling like you are going to faint. ?Have a severe headache. ?Have a fever or chills that get worse. ?These symptoms may represent a serious problem that is an emergency. Do not wait to see if the symptoms will go away. Get medical help right away. Call your local emergency services (911 in the [place] Do not drive yourself to the hospital. ?Summary ?Acute bronchitis is inflammation of the main airways (bronchi) that come off the windpipe (trachea) in the lungs. The swelling causes the airways to get smaller and make more mucus than normal. ?Drinking more fluids can help thin your mucus so it is easier to cough up. ?Take over-the-counter and prescription medicines only as told by your health care provider. ?Do not use any products that contain nicotine or tobacco. These products include cigarettes, chewing tobacco, and vaping devices, such as e-cigarettes. If you need help quitting, ask your health care provider. ?Contact a health care provider if your symptoms do not improve after 2 weeks. ?This information is not intended to replace advice given to you by your health care provider. Make sure you discuss any questions you have with your health care provider. ?Document Revised: 01/09/2021 Document Reviewed: 01/09/2021 ?Elsevier Patient Education ? 2023 [employer] ? ?

## 2022-02-12 ENCOUNTER — Emergency Department (HOSPITAL_COMMUNITY): Payer: No Typology Code available for payment source

## 2022-02-12 ENCOUNTER — Encounter (HOSPITAL_COMMUNITY): Payer: Self-pay | Admitting: Emergency Medicine

## 2022-02-12 ENCOUNTER — Emergency Department (HOSPITAL_COMMUNITY)
Admission: EM | Admit: 2022-02-12 | Discharge: 2022-02-12 | Disposition: A | Discharge status: Home / Self Care | Payer: No Typology Code available for payment source | Attending: Emergency Medicine | Admitting: Emergency Medicine

## 2022-02-12 ENCOUNTER — Other Ambulatory Visit: Payer: Self-pay

## 2022-02-12 DIAGNOSIS — Z7982 Long term (current) use of aspirin: Secondary | ICD-10-CM | POA: Insufficient documentation

## 2022-02-12 DIAGNOSIS — I10 Essential (primary) hypertension: Secondary | ICD-10-CM | POA: Insufficient documentation

## 2022-02-12 DIAGNOSIS — M79604 Pain in right leg: Secondary | ICD-10-CM | POA: Diagnosis present

## 2022-02-12 DIAGNOSIS — L03115 Cellulitis of right lower limb: Secondary | ICD-10-CM | POA: Diagnosis not present

## 2022-02-12 DIAGNOSIS — E790 Hyperuricemia without signs of inflammatory arthritis and tophaceous disease: Secondary | ICD-10-CM | POA: Diagnosis not present

## 2022-02-12 DIAGNOSIS — Z79899 Other long term (current) drug therapy: Secondary | ICD-10-CM | POA: Diagnosis not present

## 2022-02-12 LAB — CBC WITH DIFFERENTIAL/PLATELET
Abs Immature Granulocytes: 0.02 10*3/uL (ref 0.00–0.07)
Basophils Absolute: 0.1 10*3/uL (ref 0.0–0.1)
Basophils Relative: 1 %
Eosinophils Absolute: 0.3 10*3/uL (ref 0.0–0.5)
Eosinophils Relative: 5 %
HCT: 40.7 % (ref 39.0–52.0)
Hemoglobin: 13.5 g/dL (ref 13.0–17.0)
Immature Granulocytes: 0 %
Lymphocytes Relative: 20 %
Lymphs Abs: 1.1 10*3/uL (ref 0.7–4.0)
MCH: 30.1 pg (ref 26.0–34.0)
MCHC: 33.2 g/dL (ref 30.0–36.0)
MCV: 90.6 fL (ref 80.0–100.0)
Monocytes Absolute: 0.5 10*3/uL (ref 0.1–1.0)
Monocytes Relative: 9 %
Neutro Abs: 3.7 10*3/uL (ref 1.7–7.7)
Neutrophils Relative %: 65 %
Platelets: 282 10*3/uL (ref 150–400)
RBC: 4.49 MIL/uL (ref 4.22–5.81)
RDW: 14 % (ref 11.5–15.5)
WBC: 5.7 10*3/uL (ref 4.0–10.5)
nRBC: 0 % (ref 0.0–0.2)

## 2022-02-12 LAB — BASIC METABOLIC PANEL
Anion gap: 8 (ref 5–15)
BUN: 29 mg/dL — ABNORMAL HIGH (ref 8–23)
CO2: 26 mmol/L (ref 22–32)
Calcium: 9.3 mg/dL (ref 8.9–10.3)
Chloride: 103 mmol/L (ref 98–111)
Creatinine, Ser: 1.47 mg/dL — ABNORMAL HIGH (ref 0.61–1.24)
GFR, Estimated: 45 mL/min — ABNORMAL LOW (ref >60)
Glucose, Bld: 106 mg/dL — ABNORMAL HIGH (ref 70–99)
Potassium: 3.8 mmol/L (ref 3.5–5.1)
Sodium: 137 mmol/L (ref 135–145)

## 2022-02-12 LAB — URIC ACID: Uric Acid, Serum: 9.1 mg/dL — ABNORMAL HIGH (ref 3.7–8.6)

## 2022-02-12 MED ORDER — DOXYCYCLINE HYCLATE 100 MG PO TABS
100.0000 mg | ORAL_TABLET | Freq: Once | ORAL | Status: AC
  Administered 2022-02-12: 100 mg via ORAL
  Filled 2022-02-12: qty 1

## 2022-02-12 MED ORDER — DOXYCYCLINE HYCLATE 100 MG PO CAPS
100.0000 mg | ORAL_CAPSULE | Freq: Two times a day (BID) | ORAL | 0 refills | Status: DC
Start: 2022-02-12 — End: 2022-05-21

## 2022-02-12 NOTE — Discharge Instructions (Signed)
Take the entire course of the antibiotics prescribed.  Elevation of your leg as much as is comfortable and warm compresses or a gentle heating pad for 20 minutes maximum treatment several times daily can also help with this infection.

## 2022-02-12 NOTE — ED Triage Notes (Signed)
Pt having right ankle swelling, sent here by [facility] for evaluation.

## 2022-02-12 NOTE — ED Provider Notes (Signed)
[facility] EMERGENCY DEPARTMENT Provider Note   CSN: [id number] Arrival date & time: 02/12/22  1129     History  Chief Complaint  Patient presents with   Ankle Pain    Miguel Hardy is a 88 y.o. male with a history significant for hypertension, hypercholesterolemia prediabetes and history of diverticulitis, also reported history of gout per [facility] at his bedside presenting for evaluation of right lower extremity pain, swelling and redness which started gradually about 2 weeks ago.  He endorses dependent swelling of the right ankle, extends to the foot with significant weightbearing but also has a full aching sensation to his mid calf along with some skin redness of his anterior right lower leg.  He has had no injuries, denies fevers or chills.  He describes the discomfort as aching, not painful like a gouty flare typically is which he has had predominantly in his left great toe historically.  He was seen by the [facility] but sent here for further testing.  He has had no treatment prior to arrival.    The history is provided by the patient.      Home Medications Prior to Admission medications   Medication Sig Start Date End Date Taking? Authorizing Provider  doxycycline (VIBRAMYCIN) 100 MG capsule Take 1 capsule (100 mg total) by mouth 2 (two) times daily. 02/12/22  Yes Maximiano Lott, Raynelle Fanning, PA-C  albuterol (VENTOLIN HFA) 108 (90 Base) MCG/ACT inhaler Inhale 2 puffs into the lungs every 6 (six) hours as needed for wheezing or shortness of breath. 01/17/22   Junie Spencer, FNP  aspirin 81 MG tablet Take 81 mg by mouth daily.    [provider]  cetirizine (ZYRTEC) 10 MG tablet Take 1 tablet (10 mg total) by mouth daily. 01/17/22   Junie Spencer, FNP  finasteride (PROPECIA) 1 MG tablet Take 1 mg by mouth daily.    [provider]  gabapentin (NEURONTIN) 100 MG capsule Take 100 mg by mouth 3 (three) times daily.    [provider]  guaiFENesin (MUCINEX) 600 MG 12 hr  tablet Take 1 tablet (600 mg total) by mouth 2 (two) times daily. Patient not taking: Reported on 01/17/2022 10/31/21   Gwenlyn Fudge, FNP  lisinopril-hydrochlorothiazide (ZESTORETIC) 20-25 MG tablet Take 0.5 tablets by mouth in the morning and at bedtime.    [provider]  Omega-3 Fatty Acids (FISH OIL) 1000 MG CPDR Take by mouth.    [provider]  pravastatin (PRAVACHOL) 20 MG tablet Take 20 mg by mouth daily.    [provider]      Allergies    Flomax [tamsulosin hcl]    Review of Systems   Review of Systems  Constitutional:  Negative for chills and fever.  HENT:  Negative for congestion and sore throat.   Eyes: Negative.   Respiratory:  Negative for chest tightness and shortness of breath.   Cardiovascular:  Negative for chest pain.  Gastrointestinal:  Negative for abdominal pain and nausea.  Genitourinary: Negative.   Musculoskeletal:  Negative for arthralgias, joint swelling and neck pain.  Skin:  Positive for color change. Negative for rash and wound.  Neurological:  Negative for dizziness, weakness, light-headedness, numbness and headaches.  Psychiatric/Behavioral: Negative.     Physical Exam Updated Vital Signs BP (!) 217/79   Pulse 75   Temp 97.8 F (36.6 C) (Oral)   Resp 18   Ht 5\' 8"  (1.727 m)   Wt 92.5 kg   SpO2 98%  BMI 31.02 kg/m  Physical Exam Vitals and nursing note reviewed.  Constitutional:      Appearance: He is well-developed.  HENT:     Head: Normocephalic and atraumatic.  Eyes:     Conjunctiva/sclera: Conjunctivae normal.  Cardiovascular:     Rate and Rhythm: Normal rate and regular rhythm.     Heart sounds: Normal heart sounds.  Pulmonary:     Effort: Pulmonary effort is normal.     Breath sounds: Normal breath sounds. No wheezing.  Abdominal:     General: Bowel sounds are normal.     Palpations: Abdomen is soft.     Tenderness: There is no abdominal tenderness. There is no guarding.  Musculoskeletal:         General: Swelling present. Normal range of motion.     Cervical back: Normal range of motion.     Right lower leg: Edema present.     Left lower leg: No edema.  Skin:    General: Skin is warm and dry.  Neurological:     General: No focal deficit present.     Mental Status: He is alert.    ED Results / Procedures / Treatments   Labs (all labs ordered are listed, but only abnormal results are displayed) Labs Reviewed  URIC ACID - Abnormal; Notable for the following components:      Result Value   Uric Acid, Serum 9.1 (*)    All other components within normal limits  BASIC METABOLIC PANEL - Abnormal; Notable for the following components:   Glucose, Bld 106 (*)    BUN 29 (*)    Creatinine, Ser 1.47 (*)    GFR, Estimated 45 (*)    All other components within normal limits  CBC WITH DIFFERENTIAL/PLATELET    EKG None  Radiology US Venous Img Lower Right (DVT Study)  Result Date: 02/12/2022 CLINICAL DATA:  Pain, edema. EXAM: RIGHT LOWER EXTREMITY VENOUS DOPPLER ULTRASOUND TECHNIQUE: Gray-scale sonography with compression, as well as color and duplex ultrasound, were performed to evaluate the deep venous system(s) from the level of the common femoral vein through the popliteal and proximal calf veins. COMPARISON:  Ultrasound 06/04/2015. FINDINGS: VENOUS Normal compressibility of the common femoral, superficial femoral, and popliteal veins, as well as the visualized calf veins. Visualized portions of profunda femoral vein and great saphenous vein unremarkable. No filling defects to suggest DVT on grayscale or color Doppler imaging. Doppler waveforms show normal direction of venous flow, normal respiratory plasticity and response to augmentation. Limited views of the contralateral common femoral vein are unremarkable. OTHER Right lower extremity edema. IMPRESSION: 1. No evidence of DVT in the right lower extremity. 2. Right lower extremity edema. Electronically Signed   By: Feliberto HartsFrederick S Jones  M.D.   On: 02/12/2022 15:09    Procedures Procedures    Medications Ordered in ED Medications  doxycycline (VIBRA-TABS) tablet 100 mg (has no administration in time range)    ED Course/ Medical Decision Making/ A&P                           Medical Decision Making Pt with a 2 week history of worsening right lower extremity swelling, redness and mild discomfort, joint sparing.  This is unilateral.  DVT study today is negative.  He has a normal WBC count at 5.7.  He does have an elevated uric acid level at 9.1, however there does not appear to be any joint involvement with today's presentation,  specifically his ankle is nonerythematous, nontender, he has no tenderness across his toes or foot.  Right knee displays fair range of motion, no erythema present at this level.  Exam and history favoring acute cellulitis.  Started on antibiotics today.  We will plan close follow-up with his primary provider at the [place], return precautions were also outlined.   Amount and/or Complexity of Data Reviewed Labs: ordered.    Details: Normal WBC count, uric acid is elevated 9.1.           Final Clinical Impression(s) / ED Diagnoses Final diagnoses:  Cellulitis of right lower extremity    Rx / DC Orders ED Discharge Orders          Ordered    doxycycline (VIBRAMYCIN) 100 MG capsule  2 times daily        02/12/22 1550              Victoriano Lain 02/12/22 1552    Gerhard Munch, MD 02/13/22 2332

## 2022-04-30 ENCOUNTER — Ambulatory Visit: Payer: Self-pay | Admitting: *Deleted

## 2022-04-30 NOTE — Patient Instructions (Signed)
Miguel Hardy  At some point during the past 4 years, I have worked with you through the Chronic Care Management Program (CCM) at [facility]. We have not worked together within the past 6 months.   Because you are no longer a current primary care patient at [facility], I am removing myself from your care team.   Please reach out with any questions.   Demetrios Loll, BSN, RN-BC Engineer, materials Dial: [phone]

## 2022-04-30 NOTE — Chronic Care Management (AMB) (Signed)
  Chronic Care Management   Note  04/30/2022 Name: Miguel Hardy MRN: [id number] DOB: [birth year]  Due to changes in the Chronic Care Management (CCM) Program at [facility] I am reviewing and closing old Care Management Care plans. Since you are no longer a primary care patient at [facility], I am removing myself as the RN Care Manager from the Care Team and closing any RN Care Management Care Plans. The patient has not worked with the Medical illustrator within the past 6 months.   Patient does not have a current CCM referral placed since 01/20/22 and is not a primary care patient at [facility]. CCM enrollment status changed to "not enrolled".   Demetrios Loll, BSN, RN-BC Engineer, materials Dial: [phone]

## 2022-05-21 ENCOUNTER — Encounter: Payer: Self-pay | Admitting: Family Medicine

## 2022-05-21 ENCOUNTER — Ambulatory Visit (INDEPENDENT_AMBULATORY_CARE_PROVIDER_SITE_OTHER): Payer: Medicare Other | Admitting: Family Medicine

## 2022-05-21 VITALS — BP 133/62 | HR 61 | Temp 97.6°F | Ht 68.0 in | Wt 208.0 lb

## 2022-05-21 DIAGNOSIS — R609 Edema, unspecified: Secondary | ICD-10-CM | POA: Diagnosis not present

## 2022-05-21 DIAGNOSIS — L03116 Cellulitis of left lower limb: Secondary | ICD-10-CM

## 2022-05-21 MED ORDER — DOXYCYCLINE HYCLATE 100 MG PO TABS
100.0000 mg | ORAL_TABLET | Freq: Two times a day (BID) | ORAL | 0 refills | Status: AC
Start: 2022-05-21 — End: 2022-05-28

## 2022-05-21 NOTE — Patient Instructions (Signed)

## 2022-05-21 NOTE — Progress Notes (Signed)
   Acute Office Visit  Subjective:     Patient ID: Miguel Hardy, male    DOB: 1929/10/26, 88 y.o.   MRN: [id number]  Chief Complaint  Patient presents with   Edema    HPI Patient is in today for pain on the top of his left foot for the last few days. The area is also warm and tender. Denies injury, fever, chills, exudate, chest pain, or shortness of breath. He has chronic peripheral edema and reports that it is about the same of his typically. He has not been wearing compression socks. Reports that edema improves with elevation.   ROS As per HPI.      Objective:    BP 133/62   Pulse 61   Temp 97.6 F (36.4 C) (Temporal)   Ht 5\' 8"  (1.727 m)   Wt 208 lb (94.3 kg)   SpO2 98%   BMI 31.63 kg/m    Physical Exam Vitals and nursing note reviewed.  Constitutional:      General: He is not in acute distress.    Appearance: He is not ill-appearing, toxic-appearing or diaphoretic.  Cardiovascular:     Rate and Rhythm: Normal rate and regular rhythm.     Heart sounds: Normal heart sounds. No murmur heard. Pulmonary:     Effort: Pulmonary effort is normal. No respiratory distress.  Musculoskeletal:     Right lower leg: 2+ Pitting Edema present.     Left lower leg: 2+ Pitting Edema present.     Comments: Warmth, erythema, and tenderness to dorsal aspect of left foot.   Neurological:     General: No focal deficit present.     Mental Status: He is alert and oriented to person, place, and time.  Psychiatric:        Mood and Affect: Mood normal.        Behavior: Behavior normal.     No results found for any visits on 05/21/22.      Assessment & Plan:   Miguel Hardy was seen today for edema.  Diagnoses and all orders for this visit:  Cellulitis of left foot Unna boot applied today. Doxycyline as below.  -     doxycycline (VIBRA-TABS) 100 MG tablet; Take 1 tablet (100 mg total) by mouth 2 (two) times daily for 7 days. 1 po bid  Peripheral edema Chronic. Bilateral. Discussed  low salt diet, elevation. Will apply unna boot to left leg today, discussed compression sock for right leg.    Return in about 6 days (around 05/27/2022) for unna boot.  07/27/2022, FNP

## 2022-05-27 ENCOUNTER — Encounter: Payer: Self-pay | Admitting: Nurse Practitioner

## 2022-05-27 ENCOUNTER — Ambulatory Visit (INDEPENDENT_AMBULATORY_CARE_PROVIDER_SITE_OTHER): Payer: Medicare Other | Admitting: Nurse Practitioner

## 2022-05-27 VITALS — BP 147/71 | HR 64 | Temp 97.5°F | Resp 20 | Ht 68.0 in | Wt 208.0 lb

## 2022-05-27 DIAGNOSIS — L03116 Cellulitis of left lower limb: Secondary | ICD-10-CM

## 2022-05-27 NOTE — Patient Instructions (Signed)
Cellulitis, Adult  Cellulitis is a skin infection. The infected area is often warm, red, swollen, and sore. It occurs most often in the arms and lower legs. It is very important to get treated for this condition. What are the causes? This condition is caused by bacteria. The bacteria enter through a break in the skin, such as a cut, burn, insect bite, open sore, or crack. What increases the risk? This condition is more likely to occur in people who: Have a weak body defense system (immune system). Have open cuts, burns, bites, or scrapes on the skin. Are older than 86 years of age. Have a blood sugar problem (diabetes). Have a long-lasting (chronic) liver disease (cirrhosis) or kidney disease. Are very overweight (obese). Have a skin problem, such as: Itchy rash (eczema). Slow movement of blood in the veins (venous stasis). Fluid buildup below the skin (edema). Have been treated with high-energy rays (radiation). Use IV drugs. What are the signs or symptoms? Symptoms of this condition include: Skin that is: Red. Streaking. Spotting. Swollen. Sore or painful when you touch it. Warm. A fever. Chills. Blisters. How is this diagnosed? This condition is diagnosed based on: Medical history. Physical exam. Blood tests. Imaging tests. How is this treated? Treatment for this condition may include: Medicines to treat infections or allergies. Home care, such as: Rest. Placing cold or warm cloths (compresses) on the skin. Hospital care, if the condition is very bad. Follow these instructions at home: Medicines Take over-the-counter and prescription medicines only as told by your doctor. If you were prescribed an antibiotic medicine, take it as told by your doctor. Do not stop taking it even if you start to feel better. General instructions  Drink enough fluid to keep your pee (urine) pale yellow. Do not touch or rub the infected area. Raise (elevate) the infected area above  the level of your heart while you are sitting or lying down. Place cold or warm cloths on the area as told by your doctor. Keep all follow-up visits as told by your doctor. This is important. Contact a doctor if: You have a fever. You do not start to get better after 1-2 days of treatment. Your bone or joint under the infected area starts to hurt after the skin has healed. Your infection comes back. This can happen in the same area or another area. You have a swollen bump in the area. You have new symptoms. You feel ill and have muscle aches and pains. Get help right away if: Your symptoms get worse. You feel very sleepy. You throw up (vomit) or have watery poop (diarrhea) for a long time. You see red streaks coming from the area. Your red area gets larger. Your red area turns dark in color. These symptoms may represent a serious problem that is an emergency. Do not wait to see if the symptoms will go away. Get medical help right away. Call your local emergency services (911 in the [place] Do not drive yourself to the hospital. Summary Cellulitis is a skin infection. The area is often warm, red, swollen, and sore. This condition is treated with medicines, rest, and cold and warm cloths. Take all medicines only as told by your doctor. Tell your doctor if symptoms do not start to get better after 1-2 days of treatment. This information is not intended to replace advice given to you by your health care provider. Make sure you discuss any questions you have with your health care provider. Document Revised: 06/20/2021 Document   Reviewed: 06/20/2021 Elsevier Patient Education  2023 [employer]  

## 2022-05-27 NOTE — Progress Notes (Addendum)
   Subjective:    Patient ID: Miguel Hardy, male    DOB: 1930/08/05, 88 y.o.   MRN: [id number]   Chief Complaint: Recheck left leg   HPI Patient is in today for recheck of left lower ext cellulitis. He was seen on 05/21/22 by M. Rakes, NO. He was given doxycycline and unna boot was applied. He is here today for recheck.    Review of Systems  Constitutional:  Negative for diaphoresis.  Eyes:  Negative for pain.  Respiratory:  Negative for shortness of breath.   Cardiovascular:  Negative for chest pain, palpitations and leg swelling.  Gastrointestinal:  Negative for abdominal pain.  Endocrine: Negative for polydipsia.  Skin:  Negative for rash.  Neurological:  Negative for dizziness, weakness and headaches.  Hematological:  Does not bruise/bleed easily.  All other systems reviewed and are negative.      Objective:   Physical Exam Vitals reviewed.  Constitutional:      Appearance: Normal appearance.  Cardiovascular:     Rate and Rhythm: Normal rate and regular rhythm.     Heart sounds: Murmur (2/6 heard loudest at aortic valve) heard.  Pulmonary:     Effort: Pulmonary effort is normal.     Breath sounds: Normal breath sounds.  Musculoskeletal:     Left lower leg: Edema (1+ ankle edema) present.  Skin:    General: Skin is warm.  Neurological:     General: No focal deficit present.     Mental Status: He is alert and oriented to person, place, and time.  Psychiatric:        Mood and Affect: Mood normal.        Behavior: Behavior normal.    BP (!) 147/71   Pulse 64   Temp (!) 97.5 F (36.4 C) (Temporal)   Resp 20   Ht 5\' 8"  (1.727 m)   Wt 208 lb (94.3 kg)   SpO2 97%   BMI 31.63 kg/m         Assessment & Plan:   in today with chief complaint of Recheck left leg   1. Cellulitis of left foot Finish doxycycline Elevate leg when sitting RTO prn    The above assessment and management plan was discussed with the patient. The patient  verbalized understanding of and has agreed to the management plan. Patient is aware to call the clinic if symptoms persist or worsen. Patient is aware when to return to the clinic for a follow-up visit. Patient educated on when it is appropriate to go to the emergency department.   Miguel Miguel [name], FNP

## 2022-06-05 ENCOUNTER — Ambulatory Visit (INDEPENDENT_AMBULATORY_CARE_PROVIDER_SITE_OTHER): Payer: Medicare Other

## 2022-06-05 DIAGNOSIS — Z0000 Encounter for general adult medical examination without abnormal findings: Secondary | ICD-10-CM

## 2022-06-05 NOTE — Progress Notes (Signed)
MEDICARE ANNUAL WELLNESS VISIT  06/05/2022  Telephone Visit Disclaimer This Medicare AWV was conducted by telephone due to national recommendations for restrictions regarding the COVID-19 Pandemic (e.g. social distancing).  I verified, using two identifiers, that I am speaking with Miguel Hardy or their authorized healthcare agent. I discussed the limitations, risks, security, and privacy concerns of performing an evaluation and management service by telephone and the potential availability of an in-person appointment in the future. The patient expressed understanding and agreed to proceed.  Location of Patient: Home  Location of Provider (nurse):  [facility]  Subjective:    Miguel Hardy is a 88 y.o. male patient of Dettinger, Fransisca Kaufmann, MD who had a Medicare Annual Wellness Visit today via telephone. Angelica is Retired and lives alone in an apartment.   He has two children. He reports that he is socially active and does interact with friends/family regularly. He is minimally physically active and enjoys working puzzles.  Patient Care Team: Dettinger, Fransisca Kaufmann, MD as PCP - General (Family Medicine)     06/05/2022   12:06 PM 02/12/2022   12:26 PM 09/17/2021    9:48 PM 06/04/2021   12:40 PM 12/20/2019   10:47 AM 03/22/2019   10:09 AM 09/22/2013    1:24 AM  Advanced Directives  Does Patient Have a Medical Advance Directive? Yes No No Yes Yes Yes Patient has advance directive, copy not in chart  Type of Advance Directive Living will   [place];Living will Living will Living will Living will  Does patient want to make changes to medical advance directive? No - Patient declined    No - Patient declined No - Patient declined No  Copy of Healthcare Power of Attorney in Chart?    No - copy requested   Copy requested from family  Would patient like information on creating a medical advance directive?  No - Patient declined No - Patient declined   No - Patient declined    Pre-existing out of facility DNR order (yellow form or pink MOST form)       No    Hospital Utilization Over the Past 12 Months: # of hospitalizations or ER visits: 1 # of surgeries: 0  Review of Systems    Patient reports that his overall health is unchanged compared to last year.  History obtained from chart review and the patient  Patient Reported Readings (BP, Pulse, CBG, Weight, etc) none  Pain Assessment Pain : No/denies pain     Current Medications & Allergies (verified) Allergies as of 06/05/2022       Reactions   Flomax [tamsulosin Hcl]         Medication List        Accurate as of June 05, 2022 12:13 PM. If you have any questions, ask your nurse or doctor.          albuterol 108 (90 Base) MCG/ACT inhaler Commonly known as: VENTOLIN HFA Inhale 2 puffs into the lungs every 6 (six) hours as needed for wheezing or shortness of breath.   aspirin 81 MG tablet Take 81 mg by mouth daily.   cetirizine 10 MG tablet Commonly known as: ZYRTEC Take 1 tablet (10 mg total) by mouth daily.   finasteride 1 MG tablet Commonly known as: PROPECIA Take 1 mg by mouth daily.   Fish Oil 1000 MG Cpdr Take by mouth.   gabapentin 100 MG capsule Commonly known as: NEURONTIN Take 100 mg by mouth  3 (three) times daily.   lisinopril-hydrochlorothiazide 20-25 MG tablet Commonly known as: ZESTORETIC Take 0.5 tablets by mouth in the morning and at bedtime.   pravastatin 20 MG tablet Commonly known as: PRAVACHOL Take 20 mg by mouth daily.        History (reviewed): Past Medical History:  Diagnosis Date   Arthritis    Bowel obstruction (HCC) 2015   Cataract    Hyperlipidemia    Hypertension    Obesity    Prostate enlargement    Past Surgical History:  Procedure Laterality Date   APPENDECTOMY     CATARACT EXTRACTION, BILATERAL  03/23/2015   EYE SURGERY     REPLACEMENT TOTAL KNEE Right    Family History  Problem Relation Age of Onset   Heart  disease Mother    Stroke Father    Alcohol abuse Brother    Social History   Socioeconomic History   Marital status: Widowed    Spouse name: Nellie   Number of children: 3   Years of education: 1 year of college   Highest education level: Some college, no degree  Occupational History   Occupation: Retired  Tobacco Use   Smoking status: Former    Packs/day: 1.00    Years: 40.00    Total pack years: 40.00    Types: Cigarettes    Start date: 02/08/1948    Quit date: 09/22/1974    Years since quitting: 47.7   Smokeless tobacco: Never  Vaping Use   Vaping Use: Never used  Substance and Sexual Activity   Alcohol use: No   Drug use: No   Sexual activity: Not Currently  Other Topics Concern   Not on file  Social History Narrative   Lives alone   Daughter lives nearby   He gets most preventive care through [place] in [place]   Social Determinants of Health   Financial Resource Strain: Low Risk  (06/04/2021)   Overall Financial Resource Strain (CARDIA)    Difficulty of Paying Living Expenses: Not hard at all  Food Insecurity: No Food Insecurity (06/04/2021)   Hunger Vital Sign    Worried About Running Out of Food in the Last Year: Never true    Ran Out of Food in the Last Year: Never true  Transportation Needs: No Transportation Needs (06/04/2021)   PRAPARE - Administrator, Civil Service (Medical): No    Lack of Transportation (Non-Medical): No  Physical Activity: Insufficiently Active (06/04/2021)   Exercise Vital Sign    Days of Exercise per Week: 7 days    Minutes of Exercise per Session: 20 min  Stress: No Stress Concern Present (06/04/2021)   [employer] of Occupational Health - Occupational Stress Questionnaire    Feeling of Stress : Not at all  Social Connections: Moderately Integrated (06/04/2021)   Social Connection and Isolation Panel [NHANES]    Frequency of Communication with Friends and Family: More than three times a week    Frequency of Social  Gatherings with Friends and Family: More than three times a week    Attends Religious Services: More than 4 times per year    Active Member of [employer] or Organizations: Yes    Attends Banker Meetings: More than 4 times per year    Marital Status: Widowed    Activities of Daily Living    06/05/2022   12:07 PM  In your present state of health, do you have any difficulty performing the following activities:  Hearing?  1  Vision? 0  Difficulty concentrating or making decisions? 1  Walking or climbing stairs? 0  Dressing or bathing? 0  Doing errands, shopping? 0  Preparing Food and eating ? N  Using the Toilet? N  In the past six months, have you accidently leaked urine? N  Do you have problems with loss of bowel control? N  Managing your Medications? N  Managing your Finances? N  Housekeeping or managing your Housekeeping? N   Patient reports some hearing loss but he does wear hearing aids.  He has an upcoming appointment with the [facility] next week and plans to speak with them about getting a hearing test.  He feels that his memory has worsened but does not feel that it is as bad as some people his age.  Patient Education/ Literacy How often do you need to have someone help you when you read instructions, pamphlets, or other written materials from your doctor or pharmacy?: 1 - Never What is the last grade level you completed in school?: 12th grade  Exercise Current Exercise Habits: The patient does not participate in regular exercise at present, Exercise limited by: None identified  Diet Patient reports consuming 2 meals a day and 1 snack(s) a day Patient reports that his primary diet is: Regular Patient reports that he does have regular access to food.   Depression Screen    05/27/2022    8:07 AM 05/21/2022    2:55 PM 01/17/2022   10:47 AM 10/31/2021   11:21 AM 06/04/2021   12:39 PM 06/18/2020    9:18 AM 04/19/2020   12:18 PM  PHQ 2/9 Scores  PHQ - 2 Score 0 0 1 1 0 0 0   PHQ- 9 Score 1 3 3 4         Fall Risk    06/05/2022   12:13 PM 05/27/2022    8:06 AM 05/21/2022    2:55 PM 10/31/2021   11:21 AM 06/04/2021   12:37 PM  Fall Risk   Falls in the past year? 1 0 0 0 0  Number falls in past yr: 1    0  Injury with Fall? 0    0  Risk for fall due to : History of fall(s)    Impaired balance/gait;Impaired vision;Orthopedic patient  Follow up Falls evaluation completed    Education provided;Falls prevention discussed     Objective:  Miguel Hardy seemed alert and oriented and he participated appropriately during our telephone visit.  Blood Pressure Weight BMI  BP Readings from Last 3 Encounters:  05/27/22 (!) 147/71  05/21/22 133/62  02/12/22 (!) 126/101   Wt Readings from Last 3 Encounters:  05/27/22 208 lb (94.3 kg)  05/21/22 208 lb (94.3 kg)  02/12/22 204 lb (92.5 kg)   BMI Readings from Last 1 Encounters:  05/27/22 31.63 kg/m    *Unable to obtain current vital signs, weight, and BMI due to telephone visit type  Hearing/Vision  Antonious did not seem to have difficulty with hearing/understanding during the telephone conversation Reports that he has had a formal eye exam by an eye care professional within the past year Reports that he has had a formal hearing evaluation within the past year *Unable to fully assess hearing and vision during telephone visit type  Cognitive Function:    06/05/2022   12:10 PM 06/04/2021   12:37 PM 12/20/2019   10:52 AM 12/20/2019   10:50 AM  6CIT Screen  What Year? 0 points 0 points  0  points  What month?  0 points  0 points  What time? 0 points 0 points  0 points  Count back from 20 0 points 0 points  0 points  Months in reverse 0 points 0 points 0 points   Repeat phrase 4 points 4 points 4 points   Total Score  4 points     (Normal:0-7, Significant for Dysfunction: >8)  Normal Cognitive Function Screening: Yes   Immunization & Health Maintenance Record Immunization History  Administered Date(s)  Administered   Fluad Quad(high Dose 65+) 06/18/2020   Influenza-Unspecified 10/04/2013, 06/22/2018, 10/15/2018, 10/15/2018   Moderna Sars-Covid-2 Vaccination 11/21/2019, 12/19/2019   Pneumococcal Conjugate-13 09/22/2014   Tdap 10/13/2013   Zoster Recombinat (Shingrix) 04/25/2021    Health Maintenance  Topic Date Due   Pneumonia Vaccine 76+ Years old (2 - PPSV23 or PCV20) 09/23/2015   COVID-19 Vaccine (3 - Moderna series) 02/13/2020   Zoster Vaccines- Shingrix (2 of 2) 06/20/2021   INFLUENZA VACCINE  12/21/2022 (Originally 04/22/2022)   TETANUS/TDAP  10/14/2023   HPV VACCINES  Aged Out       Assessment  This is a routine wellness examination for [employer].  Health Maintenance: Due or Overdue Health Maintenance Due  Topic Date Due   Pneumonia Vaccine 34+ Years old (2 - PPSV23 or PCV20) 09/23/2015   COVID-19 Vaccine (3 - Moderna series) 02/13/2020   Zoster Vaccines- Shingrix (2 of 2) 06/20/2021    Miguel Hardy does not need a referral for Community Assistance: Care Management:   no Social Work:    no Prescription Assistance:  no Nutrition/Diabetes Education:  no   Plan:  Personalized Goals  Goals Addressed             This Visit's Progress    Patient Stated       06/05/2022 AWV Goal: Fall Prevention  Over the next year, patient will decrease their risk for falls by: Using assistive devices, such as a cane or walker, as needed Identifying fall risks within their home and correcting them by: Removing throw rugs Adding handrails to stairs or ramps Removing clutter and keeping a clear pathway throughout the home Increasing light, especially at night Adding shower handles/bars Raising toilet seat Identifying potential personal risk factors for falls: Medication side effects Incontinence/urgency Vestibular dysfunction Hearing loss Musculoskeletal disorders Neurological disorders Orthostatic hypotension         Personalized Health Maintenance &  Screening Recommendations  Pneumococcal vaccine  Influenza vaccine Shingrix vaccine  Lung Cancer Screening Recommended: no (Low Dose CT Chest recommended if Age 47-80 years, 30 pack-year currently smoking OR have quit w/in past 15 years) Hepatitis C Screening recommended: no HIV Screening recommended: no  Advanced Directives: Written information was not prepared per patient's request.  Referrals & Orders No orders of the defined types were placed in this encounter.   Follow-up Plan Follow-up with Dettinger, Fransisca Kaufmann, MD as planned    I have personally reviewed and noted the following in the patient's chart:   Medical and social history Use of alcohol, tobacco or illicit drugs  Current medications and supplements Functional ability and status Nutritional status Physical activity Advanced directives List of other physicians Hospitalizations, surgeries, and ER visits in previous 12 months Vitals Screenings to include cognitive, depression, and falls Referrals and appointments  In addition, I have reviewed and discussed with Miguel Hardy certain preventive protocols, quality metrics, and best practice recommendations. A written personalized care plan for preventive services as well as general  preventive health recommendations is available and can be mailed to the patient at his request.      Ulice Brilliant  06/05/2022   Patient declined after visit summary

## 2022-11-12 ENCOUNTER — Telehealth (INDEPENDENT_AMBULATORY_CARE_PROVIDER_SITE_OTHER): Payer: Medicare Other | Admitting: Family Medicine

## 2022-11-12 ENCOUNTER — Encounter: Payer: Self-pay | Admitting: Family Medicine

## 2022-11-12 DIAGNOSIS — J0100 Acute maxillary sinusitis, unspecified: Secondary | ICD-10-CM | POA: Diagnosis not present

## 2022-11-12 MED ORDER — AMOXICILLIN-POT CLAVULANATE 875-125 MG PO TABS: 1.0000 | ORAL_TABLET | Freq: Two times a day (BID) | ORAL | 0 refills | Status: DC

## 2022-11-12 NOTE — Progress Notes (Signed)
Virtual Visit via telephone Note  I connected with Miguel Hardy on 11/12/22 at 1745 by telephone and verified that I am speaking with the correct person using two identifiers. Miguel Hardy is currently located at home and patient are currently with her during visit. The provider, Fransisca Kaufmann Chan Rosasco, MD is located in their office at time of visit.  Call ended at 1751  I discussed the limitations, risks, security and privacy concerns of performing an evaluation and management service by telephone and the availability of in person appointments. I also discussed with the patient that there may be a patient responsible charge related to this service. The patient expressed understanding and agreed to proceed.   History and Present Illness: Patient is calling in for 2 days of cold and head and congestion.  He is taking tylenol cough and cold.  He is a little better today.  He denies fevers or chills. He denies SOB or wheezing.  He says it is mostly head congestion and sinus pressure.   1. Acute non-recurrent maxillary sinusitis     Outpatient Encounter Medications as of 11/12/2022  Medication Sig   amoxicillin-clavulanate (AUGMENTIN) 875-125 MG tablet Take 1 tablet by mouth 2 (two) times daily.   albuterol (VENTOLIN HFA) 108 (90 Base) MCG/ACT inhaler Inhale 2 puffs into the lungs every 6 (six) hours as needed for wheezing or shortness of breath.   aspirin 81 MG tablet Take 81 mg by mouth daily.   cetirizine (ZYRTEC) 10 MG tablet Take 1 tablet (10 mg total) by mouth daily.   finasteride (PROPECIA) 1 MG tablet Take 1 mg by mouth daily.   gabapentin (NEURONTIN) 100 MG capsule Take 100 mg by mouth 3 (three) times daily.   lisinopril-hydrochlorothiazide (ZESTORETIC) 20-25 MG tablet Take 0.5 tablets by mouth in the morning and at bedtime.   Omega-3 Fatty Acids (FISH OIL) 1000 MG CPDR Take by mouth.   pravastatin (PRAVACHOL) 20 MG tablet Take 20 mg by mouth daily.   No facility-administered  encounter medications on file as of 11/12/2022.    Review of Systems  Constitutional:  Negative for chills and fever.  HENT:  Positive for congestion, postnasal drip, rhinorrhea and sinus pressure. Negative for ear discharge, ear pain, sneezing, sore throat and voice change.   Eyes:  Negative for pain, discharge, redness and visual disturbance.  Respiratory:  Positive for cough. Negative for shortness of breath and wheezing.   Cardiovascular:  Negative for chest pain and leg swelling.  Musculoskeletal:  Negative for gait problem.  Skin:  Negative for rash.  All other systems reviewed and are negative.   Observations/Objective: Patient sounds comfortable and in no acute distress  Assessment and Plan: Problem List Items Addressed This Visit   None Visit Diagnoses     Acute non-recurrent maxillary sinusitis    -  Primary   Relevant Medications   amoxicillin-clavulanate (AUGMENTIN) 875-125 MG tablet     Give amoxicillin because of age and history of severe infections  Recommended Mucinex and Flonase as well  Follow up plan: Return if symptoms worsen or fail to improve.     I discussed the assessment and treatment plan with the patient. The patient was provided an opportunity to ask questions and all were answered. The patient agreed with the plan and demonstrated an understanding of the instructions.   The patient was advised to call back or seek an in-person evaluation if the symptoms worsen or if the condition fails to improve as anticipated.  The above assessment and management plan was discussed with the patient. The patient verbalized understanding of and has agreed to the management plan. Patient is aware to call the clinic if symptoms persist or worsen. Patient is aware when to return to the clinic for a follow-up visit. Patient educated on when it is appropriate to go to the emergency department.    I provided 6 minutes of non-face-to-face time during this  encounter.    Worthy Rancher, MD

## 2022-11-12 NOTE — Patient Instructions (Signed)
, °

## 2023-02-13 ENCOUNTER — Ambulatory Visit (INDEPENDENT_AMBULATORY_CARE_PROVIDER_SITE_OTHER): Payer: Medicare Other | Admitting: Family Medicine

## 2023-02-13 ENCOUNTER — Encounter: Payer: Self-pay | Admitting: Family Medicine

## 2023-02-13 VITALS — BP 127/72 | HR 60 | Temp 97.8°F | Ht 68.0 in | Wt 204.0 lb

## 2023-02-13 DIAGNOSIS — J209 Acute bronchitis, unspecified: Secondary | ICD-10-CM

## 2023-02-13 MED ORDER — CEFDINIR 300 MG PO CAPS: 300.0000 mg | ORAL_CAPSULE | Freq: Two times a day (BID) | ORAL | 0 refills | Status: DC

## 2023-02-13 MED ORDER — PREDNISONE 20 MG PO TABS: ORAL_TABLET | ORAL | 0 refills | Status: DC

## 2023-02-13 MED ORDER — FLUTICASONE PROPIONATE 50 MCG/ACT NA SUSP: 1.0000 | Freq: Two times a day (BID) | NASAL | 6 refills | Status: AC | PRN

## 2023-02-13 NOTE — Progress Notes (Signed)
BP 127/72   Pulse 60   Temp 97.8 F (36.6 C)   Ht 5\' 8"  (1.727 m)   Wt 204 lb (92.5 kg)   SpO2 97%   BMI 31.02 kg/m    Subjective:   Patient ID: Miguel Hardy, male    DOB: 05-May-1930, 89 y.o.   MRN: [id number]  HPI: Miguel Hardy is a 89 y.o. male presenting on 02/13/2023 for URI   HPI Cough and congestion and wheezing and chest congestion. Patient comes in today with complaints of cough and congestion and wheezing and chest congestion.  He says it started about a week ago when he was visiting family in [place] with his grandkids and then he got sick after and he was feeling achy and had some chills but denies any fevers.  He is still coughing and has congestion.  He says this got a little better but he just feels like he cannot clear it or get out of his chest.  He is using Tylenol but is not using anything else at this point.  Relevant past medical, surgical, family and social history reviewed and updated as indicated. Interim medical history since our last visit reviewed. Allergies and medications reviewed and updated.  Review of Systems  Constitutional:  Negative for chills and fever.  HENT:  Positive for congestion, postnasal drip, rhinorrhea and sinus pressure. Negative for ear discharge, ear pain, sneezing, sore throat and voice change.   Eyes:  Negative for pain, discharge, redness and visual disturbance.  Respiratory:  Positive for cough and wheezing. Negative for shortness of breath.   Cardiovascular:  Negative for chest pain and leg swelling.  Musculoskeletal:  Positive for myalgias. Negative for gait problem.  Skin:  Negative for rash.  All other systems reviewed and are negative.   Per HPI unless specifically indicated above   Allergies as of 02/13/2023       Reactions   Flomax [tamsulosin Hcl]         Medication List        Accurate as of Feb 13, 2023 11:17 AM. If you have any questions, ask your nurse or doctor.          STOP taking these  medications    amoxicillin-clavulanate 875-125 MG tablet Commonly known as: AUGMENTIN Stopped by: Elige Radon Raynesha Tiedt, MD       TAKE these medications    albuterol 108 (90 Base) MCG/ACT inhaler Commonly known as: VENTOLIN HFA Inhale 2 puffs into the lungs every 6 (six) hours as needed for wheezing or shortness of breath.   aspirin 81 MG tablet Take 81 mg by mouth daily.   cefdinir 300 MG capsule Commonly known as: OMNICEF Take 1 capsule (300 mg total) by mouth 2 (two) times daily. 1 po BID Started by: Elige Radon Bryonna Sundby, MD   cetirizine 10 MG tablet Commonly known as: ZYRTEC Take 1 tablet (10 mg total) by mouth daily.   finasteride 1 MG tablet Commonly known as: PROPECIA Take 1 mg by mouth daily.   Fish Oil 1000 MG Cpdr Take by mouth.   fluticasone 50 MCG/ACT nasal spray Commonly known as: FLONASE Place 1 spray into both nostrils 2 (two) times daily as needed for allergies or rhinitis. Started by: Elige Radon Hasset Chaviano, MD   gabapentin 100 MG capsule Commonly known as: NEURONTIN Take 100 mg by mouth 3 (three) times daily.   lisinopril-hydrochlorothiazide 20-25 MG tablet Commonly known as: ZESTORETIC Take 0.5 tablets by mouth in the morning and  at bedtime.   pravastatin 20 MG tablet Commonly known as: PRAVACHOL Take 20 mg by mouth daily.   predniSONE 20 MG tablet Commonly known as: DELTASONE 2 po at same time daily for 5 days Started by: Elige Radon Onofrio Klemp, MD         Objective:   BP 127/72   Pulse 60   Temp 97.8 F (36.6 C)   Ht 5\' 8"  (1.727 m)   Wt 204 lb (92.5 kg)   SpO2 97%   BMI 31.02 kg/m   Wt Readings from Last 3 Encounters:  02/13/23 204 lb (92.5 kg)  05/27/22 208 lb (94.3 kg)  05/21/22 208 lb (94.3 kg)    Physical Exam Vitals and nursing note reviewed.  Constitutional:      General: He is not in acute distress.    Appearance: He is well-developed. He is not diaphoretic.  HENT:     Right Ear: Tympanic membrane, ear canal and  external ear normal.     Left Ear: Tympanic membrane, ear canal and external ear normal.     Nose: Mucosal edema and rhinorrhea present.     Right Sinus: Maxillary sinus tenderness present. No frontal sinus tenderness.     Left Sinus: Maxillary sinus tenderness present. No frontal sinus tenderness.     Mouth/Throat:     Pharynx: Uvula midline. No oropharyngeal exudate or posterior oropharyngeal erythema.     Tonsils: No tonsillar abscesses.  Eyes:     General: No scleral icterus.       Right eye: No discharge.     Conjunctiva/sclera: Conjunctivae normal.     Pupils: Pupils are equal, round, and reactive to light.  Neck:     Thyroid: No thyromegaly.  Cardiovascular:     Rate and Rhythm: Normal rate and regular rhythm.     Heart sounds: Normal heart sounds.  Pulmonary:     Effort: Pulmonary effort is normal. No respiratory distress.     Breath sounds: Rhonchi present. No wheezing or rales.  Musculoskeletal:        General: Normal range of motion.     Cervical back: Neck supple.  Lymphadenopathy:     Cervical: No cervical adenopathy.  Skin:    General: Skin is warm and dry.     Findings: No rash.  Neurological:     Mental Status: He is alert and oriented to person, place, and time.     Coordination: Coordination normal.  Psychiatric:        Behavior: Behavior normal.       Assessment & Plan:   Problem List Items Addressed This Visit   None Visit Diagnoses     Acute bronchitis, unspecified organism    -  Primary   Relevant Medications   fluticasone (FLONASE) 50 MCG/ACT nasal spray   predniSONE (DELTASONE) 20 MG tablet   cefdinir (OMNICEF) 300 MG capsule       Will treat like possible bronchitis and given prednisone and cefdinir and Flonase.  If not improved return Follow up plan: Return if symptoms worsen or fail to improve.  Counseling provided for all of the vaccine components No orders of the defined types were placed in this encounter.   Arville Care,  MD [facility] 02/13/2023, 11:17 AM

## 2023-02-20 ENCOUNTER — Encounter: Payer: Self-pay | Admitting: Family Medicine

## 2023-02-20 ENCOUNTER — Ambulatory Visit (INDEPENDENT_AMBULATORY_CARE_PROVIDER_SITE_OTHER): Payer: Medicare Other | Admitting: Family Medicine

## 2023-02-20 VITALS — BP 158/67 | HR 66 | Temp 97.8°F | Resp 20 | Ht 68.0 in | Wt 204.0 lb

## 2023-02-20 DIAGNOSIS — J301 Allergic rhinitis due to pollen: Secondary | ICD-10-CM

## 2023-02-20 DIAGNOSIS — J060 Acute laryngopharyngitis: Secondary | ICD-10-CM

## 2023-02-20 MED ORDER — METHYLPREDNISOLONE ACETATE 40 MG/ML IJ SUSP
40.0000 mg | Freq: Once | INTRAMUSCULAR | Status: AC
  Administered 2023-02-20: 40 mg via INTRAMUSCULAR

## 2023-02-20 MED ORDER — LEVOCETIRIZINE DIHYDROCHLORIDE 5 MG PO TABS: 5.0000 mg | ORAL_TABLET | Freq: Every evening | ORAL | 1 refills | Status: AC

## 2023-02-20 NOTE — Addendum Note (Signed)
Addended by: Sonny Masters on: 02/20/2023 12:55 PM   Modules accepted: Level of Service

## 2023-02-20 NOTE — Progress Notes (Signed)
Subjective:  Patient ID: Miguel Hardy, male    DOB: 08-Oct-1929, 89 y.o.   MRN: [id number]  Patient Care Team: Dettinger, Elige Radon, MD as PCP - General (Family Medicine)   Chief Complaint:  Sore Throat   HPI: Miguel Hardy is a 89 y.o. male presenting on 02/20/2023 for Sore Throat   Sore Throat  This is a recurrent problem. The current episode started 1 to 4 weeks ago (treated with antibiotics and feels better but still has a scratchy throat and raspy voice). The problem has been waxing and waning. Neither side of throat is experiencing more pain than the other. There has been no fever. The patient is experiencing no pain. Associated symptoms include a hoarse voice. Pertinent negatives include no abdominal pain, congestion, coughing, diarrhea, drooling, ear discharge, ear pain, headaches, plugged ear sensation, neck pain, shortness of breath, stridor, swollen glands, trouble swallowing or vomiting.     Relevant past medical, surgical, family, and social history reviewed and updated as indicated.  Allergies and medications reviewed and updated. Data reviewed: Chart in Epic.   Past Medical History:  Diagnosis Date   Arthritis    Bowel obstruction (HCC) 2015   Cataract    Hyperlipidemia    Hypertension    Obesity    Prostate enlargement     Past Surgical History:  Procedure Laterality Date   APPENDECTOMY     CATARACT EXTRACTION, BILATERAL  03/23/2015   EYE SURGERY     REPLACEMENT TOTAL KNEE Right     Social History   Socioeconomic History   Marital status: Widowed    Spouse name: Nellie   Number of children: 3   Years of education: 1 year of college   Highest education level: Some college, no degree  Occupational History   Occupation: Retired  Tobacco Use   Smoking status: Former    Packs/day: 1.00    Years: 40.00    Additional pack years: 0.00    Total pack years: 40.00    Types: Cigarettes    Start date: 02/08/1948    Quit date: 09/22/1974    Years since  quitting: 48.4   Smokeless tobacco: Never  Vaping Use   Vaping Use: Never used  Substance and Sexual Activity   Alcohol use: No   Drug use: No   Sexual activity: Not Currently  Other Topics Concern   Not on file  Social History Narrative   Lives alone   Daughter lives nearby   He gets most preventive care through [place] in [place]   Social Determinants of Health   Financial Resource Strain: Low Risk  (06/04/2021)   Overall Financial Resource Strain (CARDIA)    Difficulty of Paying Living Expenses: Not hard at all  Food Insecurity: No Food Insecurity (06/04/2021)   Hunger Vital Sign    Worried About Running Out of Food in the Last Year: Never true    Ran Out of Food in the Last Year: Never true  Transportation Needs: No Transportation Needs (06/04/2021)   PRAPARE - Administrator, Civil Service (Medical): No    Lack of Transportation (Non-Medical): No  Physical Activity: Insufficiently Active (06/04/2021)   Exercise Vital Sign    Days of Exercise per Week: 7 days    Minutes of Exercise per Session: 20 min  Stress: No Stress Concern Present (06/04/2021)   [employer] of Occupational Health - Occupational Stress Questionnaire    Feeling of Stress : Not at all  Social Connections: Moderately Integrated (06/04/2021)   Social Connection and Isolation Panel [NHANES]    Frequency of Communication with Friends and Family: More than three times a week    Frequency of Social Gatherings with Friends and Family: More than three times a week    Attends Religious Services: More than 4 times per year    Active Member of [employer] or Organizations: Yes    Attends Banker Meetings: More than 4 times per year    Marital Status: Widowed  Intimate Partner Violence: Not At Risk (06/04/2021)   Humiliation, Afraid, Rape, and Kick questionnaire    Fear of Current or Ex-Partner: No    Emotionally Abused: No    Physically Abused: No    Sexually Abused: No    Outpatient  Encounter Medications as of 02/20/2023  Medication Sig   albuterol (VENTOLIN HFA) 108 (90 Base) MCG/ACT inhaler Inhale 2 puffs into the lungs every 6 (six) hours as needed for wheezing or shortness of breath.   aspirin 81 MG tablet Take 81 mg by mouth daily.   finasteride (PROPECIA) 1 MG tablet Take 1 mg by mouth daily.   fluticasone (FLONASE) 50 MCG/ACT nasal spray Place 1 spray into both nostrils 2 (two) times daily as needed for allergies or rhinitis.   gabapentin (NEURONTIN) 100 MG capsule Take 100 mg by mouth 3 (three) times daily.   levocetirizine (XYZAL) 5 MG tablet Take 1 tablet (5 mg total) by mouth every evening.   lisinopril-hydrochlorothiazide (ZESTORETIC) 20-25 MG tablet Take 0.5 tablets by mouth in the morning and at bedtime.   Omega-3 Fatty Acids (FISH OIL) 1000 MG CPDR Take by mouth.   pravastatin (PRAVACHOL) 20 MG tablet Take 20 mg by mouth daily.   [DISCONTINUED] cefdinir (OMNICEF) 300 MG capsule Take 1 capsule (300 mg total) by mouth 2 (two) times daily. 1 po BID   [DISCONTINUED] cetirizine (ZYRTEC) 10 MG tablet Take 1 tablet (10 mg total) by mouth daily.   [DISCONTINUED] predniSONE (DELTASONE) 20 MG tablet 2 po at same time daily for 5 days   [EXPIRED] methylPREDNISolone acetate (DEPO-MEDROL) injection 40 mg    No facility-administered encounter medications on file as of 02/20/2023.    Allergies  Allergen Reactions   Flomax [Tamsulosin Hcl]     Review of Systems  Constitutional:  Negative for activity change, appetite change, chills, diaphoresis, fatigue, fever and unexpected weight change.  HENT:  Positive for hoarse voice, postnasal drip, sore throat and voice change. Negative for congestion, dental problem, drooling, ear discharge, ear pain, facial swelling, hearing loss, mouth sores, nosebleeds, rhinorrhea, sinus pressure, sinus pain, sneezing, tinnitus and trouble swallowing.   Eyes: Negative.   Respiratory:  Negative for cough, chest tightness, shortness of breath  and stridor.   Cardiovascular:  Negative for chest pain, palpitations and leg swelling.  Gastrointestinal:  Negative for abdominal pain, blood in stool, constipation, diarrhea, nausea and vomiting.  Endocrine: Negative.   Genitourinary:  Negative for decreased urine volume, difficulty urinating, dysuria, frequency and urgency.  Musculoskeletal:  Negative for arthralgias, myalgias and neck pain.  Skin: Negative.   Allergic/Immunologic: Negative.   Neurological:  Negative for dizziness and headaches.  Hematological: Negative.   Psychiatric/Behavioral:  Negative for confusion, hallucinations, sleep disturbance and suicidal ideas.   All other systems reviewed and are negative.       Objective:  BP (!) 158/67   Pulse 66   Temp 97.8 F (36.6 C) (Temporal)   Resp 20   Ht 5\' 8"  (  1.727 m)   Wt 204 lb (92.5 kg)   SpO2 98%   BMI 31.02 kg/m    Wt Readings from Last 3 Encounters:  02/20/23 204 lb (92.5 kg)  02/13/23 204 lb (92.5 kg)  05/27/22 208 lb (94.3 kg)    Physical Exam Vitals and nursing note reviewed.  Constitutional:      General: He is not in acute distress.    Appearance: Normal appearance. He is well-developed. He is not ill-appearing, toxic-appearing or diaphoretic.  HENT:     Head: Normocephalic and atraumatic.     Right Ear: Tympanic membrane and ear canal normal.     Left Ear: Tympanic membrane and ear canal normal.     Nose: No congestion or rhinorrhea.     Mouth/Throat:     Mouth: Mucous membranes are moist. No oral lesions.     Pharynx: Posterior oropharyngeal erythema (with cobblestoning) present. No pharyngeal swelling, oropharyngeal exudate or uvula swelling.     Tonsils: No tonsillar exudate or tonsillar abscesses.  Eyes:     Conjunctiva/sclera: Conjunctivae normal.     Pupils: Pupils are equal, round, and reactive to light.  Cardiovascular:     Rate and Rhythm: Normal rate and regular rhythm.     Heart sounds: Murmur heard.     Systolic murmur is  present with a grade of 2/6.  Pulmonary:     Effort: Pulmonary effort is normal.     Breath sounds: Normal breath sounds. No wheezing or rhonchi.  Musculoskeletal:     Cervical back: Normal range of motion and neck supple.     Right lower leg: No edema.     Left lower leg: No edema.  Lymphadenopathy:     Cervical: No cervical adenopathy.  Skin:    General: Skin is warm and dry.     Capillary Refill: Capillary refill takes less than 2 seconds.  Neurological:     General: No focal deficit present.     Mental Status: He is alert and oriented to person, place, and time.     Gait: Gait abnormal (using cane).  Psychiatric:        Mood and Affect: Mood normal.        Behavior: Behavior normal.        Thought Content: Thought content normal.        Judgment: Judgment normal.     Results for orders placed or performed during the hospital encounter of 02/12/22  CBC with Differential  Result Value Ref Range   WBC 5.7 4.0 - 10.5 K/uL   RBC 4.49 4.22 - 5.81 MIL/uL   Hemoglobin 13.5 13.0 - 17.0 g/dL   HCT 19.1 47.8 - 29.5 %   MCV 90.6 80.0 - 100.0 fL   MCH 30.1 26.0 - 34.0 pg   MCHC 33.2 30.0 - 36.0 g/dL   RDW 62.1 30.8 - 65.7 %   Platelets 282 150 - 400 K/uL   nRBC 0.0 0.0 - 0.2 %   Neutrophils Relative % 65 %   Neutro Abs 3.7 1.7 - 7.7 K/uL   Lymphocytes Relative 20 %   Lymphs Abs 1.1 0.7 - 4.0 K/uL   Monocytes Relative 9 %   Monocytes Absolute 0.5 0.1 - 1.0 K/uL   Eosinophils Relative 5 %   Eosinophils Absolute 0.3 0.0 - 0.5 K/uL   Basophils Relative 1 %   Basophils Absolute 0.1 0.0 - 0.1 K/uL   Immature Granulocytes 0 %   Abs Immature Granulocytes 0.02 0.00 -  0.07 K/uL  Uric acid  Result Value Ref Range   Uric Acid, Serum 9.1 (H) 3.7 - 8.6 mg/dL  Basic metabolic panel  Result Value Ref Range   Sodium 137 135 - 145 mmol/L   Potassium 3.8 3.5 - 5.1 mmol/L   Chloride 103 98 - 111 mmol/L   CO2 26 22 - 32 mmol/L   Glucose, Bld 106 (H) 70 - 99 mg/dL   BUN 29 (H) 8 - 23  mg/dL   Creatinine, Ser 1.61 (H) 0.61 - 1.24 mg/dL   Calcium 9.3 8.9 - 09.6 mg/dL   GFR, Estimated 45 (L) >60 mL/min   Anion gap 8 5 - 15       Pertinent labs & imaging results that were available during my care of the patient were reviewed by me and considered in my medical decision making.  Assessment & Plan:  Zacaria was seen today for sore throat and diarrhea all day yesterday.  Diagnoses and all orders for this visit:  Sore throat and laryngitis Seasonal allergic rhinitis due to pollen Ongoing sore throat with postnasal drainage and cobblestoning of posterior oropharynx. Will change antihistamine to below to see if beneficial. Will burst with steroids for laryngitis. Pt aware of symptomatic care at home and red flags which require follow up.  -     levocetirizine (XYZAL) 5 MG tablet; Take 1 tablet (5 mg total) by mouth every evening. -     methylPREDNISolone acetate (DEPO-MEDROL) injection 40 mg     Continue all other maintenance medications.  Follow up plan: Return if symptoms worsen or fail to improve.   Continue healthy lifestyle choices, including diet (rich in fruits, vegetables, and lean proteins, and low in salt and simple carbohydrates) and exercise (at least 30 minutes of moderate physical activity daily).  Educational handout given for laryngitis  The above assessment and management plan was discussed with the patient. The patient verbalized understanding of and has agreed to the management plan. Patient is aware to call the clinic if they develop any new symptoms or if symptoms persist or worsen. Patient is aware when to return to the clinic for a follow-up visit. Patient educated on when it is appropriate to go to the emergency department.   Kari Baars, FNP-C [facility] [phone]
